# Patient Record
Sex: Female | Born: 1960 | Race: Black or African American | Hispanic: No | Marital: Married | Smoking: Former smoker
Health system: Southern US, Community
[De-identification: ages and names within clinical notes are randomized; demographics above are authoritative.]

## PROBLEM LIST (undated history)

## (undated) DIAGNOSIS — Z87898 Personal history of other specified conditions: Secondary | ICD-10-CM

## (undated) DIAGNOSIS — A64 Unspecified sexually transmitted disease: Secondary | ICD-10-CM

## (undated) DIAGNOSIS — Z8619 Personal history of other infectious and parasitic diseases: Secondary | ICD-10-CM

## (undated) DIAGNOSIS — I10 Essential (primary) hypertension: Secondary | ICD-10-CM

## (undated) DIAGNOSIS — Z8742 Personal history of other diseases of the female genital tract: Secondary | ICD-10-CM

## (undated) DIAGNOSIS — B009 Herpesviral infection, unspecified: Secondary | ICD-10-CM

## (undated) DIAGNOSIS — R7301 Impaired fasting glucose: Secondary | ICD-10-CM

## (undated) HISTORY — PX: CATARACT EXTRACTION: SUR2

## (undated) HISTORY — DX: Essential (primary) hypertension: I10

## (undated) HISTORY — DX: Impaired fasting glucose: R73.01

## (undated) HISTORY — DX: Personal history of other specified conditions: Z87.898

## (undated) HISTORY — PX: UPPER GI ENDOSCOPY: SHX6162

## (undated) HISTORY — PX: ABDOMINAL SURGERY: SHX537

## (undated) HISTORY — DX: Herpesviral infection, unspecified: B00.9

## (undated) HISTORY — DX: Personal history of other infectious and parasitic diseases: Z86.19

## (undated) HISTORY — DX: Unspecified sexually transmitted disease: A64

## (undated) HISTORY — DX: Personal history of other diseases of the female genital tract: Z87.42

---

## 1985-01-29 HISTORY — PX: ANKLE FRACTURE SURGERY: SHX122

## 1994-01-29 HISTORY — PX: TUBAL LIGATION: SHX77

## 1997-01-29 DIAGNOSIS — Z87898 Personal history of other specified conditions: Secondary | ICD-10-CM

## 1997-01-29 HISTORY — DX: Personal history of other specified conditions: Z87.898

## 1999-01-30 HISTORY — PX: CHOLECYSTECTOMY: SHX55

## 2004-01-30 DIAGNOSIS — Z8619 Personal history of other infectious and parasitic diseases: Secondary | ICD-10-CM

## 2004-01-30 HISTORY — DX: Personal history of other infectious and parasitic diseases: Z86.19

## 2006-08-26 ENCOUNTER — Other Ambulatory Visit: Admission: RE | Admit: 2006-08-26 | Discharge: 2006-08-26 | Payer: Self-pay | Admitting: Obstetrics and Gynecology

## 2007-08-27 ENCOUNTER — Other Ambulatory Visit: Admission: RE | Admit: 2007-08-27 | Discharge: 2007-08-27 | Payer: Self-pay | Admitting: Obstetrics & Gynecology

## 2007-09-24 ENCOUNTER — Other Ambulatory Visit: Admission: RE | Admit: 2007-09-24 | Discharge: 2007-09-24 | Payer: Self-pay | Admitting: Obstetrics and Gynecology

## 2010-04-20 ENCOUNTER — Other Ambulatory Visit (HOSPITAL_COMMUNITY): Payer: Self-pay | Admitting: Surgery

## 2010-04-27 ENCOUNTER — Encounter: Payer: BC Managed Care – PPO | Attending: Surgery | Admitting: *Deleted

## 2010-04-27 ENCOUNTER — Encounter: Payer: BC Managed Care – PPO | Admitting: *Deleted

## 2010-04-27 DIAGNOSIS — Z01818 Encounter for other preprocedural examination: Secondary | ICD-10-CM | POA: Insufficient documentation

## 2010-04-27 DIAGNOSIS — Z713 Dietary counseling and surveillance: Secondary | ICD-10-CM | POA: Insufficient documentation

## 2010-05-01 ENCOUNTER — Ambulatory Visit (HOSPITAL_COMMUNITY)
Admission: RE | Admit: 2010-05-01 | Discharge: 2010-05-01 | Disposition: A | Discharge status: Home / Self Care | Payer: BLUE CROSS/BLUE SHIELD | Source: Ambulatory Visit | Attending: Surgery | Admitting: Surgery

## 2010-05-01 DIAGNOSIS — Z01812 Encounter for preprocedural laboratory examination: Secondary | ICD-10-CM | POA: Insufficient documentation

## 2010-05-01 DIAGNOSIS — E785 Hyperlipidemia, unspecified: Secondary | ICD-10-CM | POA: Insufficient documentation

## 2010-05-01 DIAGNOSIS — K219 Gastro-esophageal reflux disease without esophagitis: Secondary | ICD-10-CM | POA: Insufficient documentation

## 2010-05-01 DIAGNOSIS — R609 Edema, unspecified: Secondary | ICD-10-CM | POA: Insufficient documentation

## 2010-05-01 DIAGNOSIS — Z6841 Body Mass Index (BMI) 40.0 and over, adult: Secondary | ICD-10-CM | POA: Insufficient documentation

## 2010-05-09 ENCOUNTER — Ambulatory Visit (HOSPITAL_COMMUNITY)
Admission: RE | Admit: 2010-05-09 | Discharge: 2010-05-09 | Disposition: A | Discharge status: Home / Self Care | Payer: BC Managed Care – PPO | Source: Ambulatory Visit | Attending: Surgery | Admitting: Surgery

## 2010-05-09 DIAGNOSIS — K219 Gastro-esophageal reflux disease without esophagitis: Secondary | ICD-10-CM | POA: Insufficient documentation

## 2010-05-09 DIAGNOSIS — Z6841 Body Mass Index (BMI) 40.0 and over, adult: Secondary | ICD-10-CM | POA: Insufficient documentation

## 2010-05-09 DIAGNOSIS — R609 Edema, unspecified: Secondary | ICD-10-CM | POA: Insufficient documentation

## 2010-05-09 DIAGNOSIS — Z13820 Encounter for screening for osteoporosis: Secondary | ICD-10-CM | POA: Insufficient documentation

## 2010-05-09 DIAGNOSIS — E785 Hyperlipidemia, unspecified: Secondary | ICD-10-CM | POA: Insufficient documentation

## 2010-05-18 ENCOUNTER — Ambulatory Visit (HOSPITAL_BASED_OUTPATIENT_CLINIC_OR_DEPARTMENT_OTHER): Payer: BC Managed Care – PPO | Attending: Surgery

## 2010-05-18 DIAGNOSIS — R0989 Other specified symptoms and signs involving the circulatory and respiratory systems: Secondary | ICD-10-CM | POA: Insufficient documentation

## 2010-05-18 DIAGNOSIS — G4733 Obstructive sleep apnea (adult) (pediatric): Secondary | ICD-10-CM | POA: Insufficient documentation

## 2010-05-18 DIAGNOSIS — R0609 Other forms of dyspnea: Secondary | ICD-10-CM | POA: Insufficient documentation

## 2010-05-27 DIAGNOSIS — G4730 Sleep apnea, unspecified: Secondary | ICD-10-CM

## 2010-05-27 DIAGNOSIS — R0609 Other forms of dyspnea: Secondary | ICD-10-CM

## 2010-05-27 DIAGNOSIS — G4710 Hypersomnia, unspecified: Secondary | ICD-10-CM

## 2010-05-27 DIAGNOSIS — R0989 Other specified symptoms and signs involving the circulatory and respiratory systems: Secondary | ICD-10-CM

## 2010-05-27 NOTE — Procedures (Signed)
NAMETERRY, Natalie Harding           ACCOUNT NO.:  [id number]  MEDICAL RECORD NO.:  [id number]          PATIENT TYPE:  OUT  LOCATION:  SLEEP CENTER                 FACILITY:  [facility]  PHYSICIAN:  Omolola Mittman D. Maple Hudson, MD, FCCP, FACPDATE OF BIRTH:  [birth year] 15, 1960  DATE OF STUDY:  05/18/2010                           NOCTURNAL POLYSOMNOGRAM  REFERRING PHYSICIAN:  MATTHEW B MARTIN  INDICATION FOR STUDY:  Hypersomnia with sleep apnea.  EPWORTH SLEEPINESS SCORE:  1/24, BMI 54.8.  Weight 319 pounds, height 64 inches.  Neck 14 inches.  MEDICATIONS:  Home medications are charted and reviewed.  SLEEP ARCHITECTURE:  Split-study protocol.  During the diagnostic phase, total sleep time 114 minutes with sleep efficiency 56.9%.  Stage I was 14.9%, stage II 70.2%, stage III absent, REM 14.9% of total sleep time. Sleep latency 19 minutes, REM latency 130 minutes, awake after sleep onset 51 minutes, arousal index 53.2.  BEDTIME MEDICATION:  None.  RESPIRATORY DATA:  Split-study protocol.  Apnea-hypopnea index (AHI) 16.8 per hour.  A total of 32 events was scored including 22 obstructive apneas, 3 central apneas, 7 hypopneas.  The events were seen in all sleep positions.  REM/AHI 77.6.  CPAP was then titrated to 14 CWP, AHI 0 per hour.  She wore a medium ResMed Mirage Quattro full-face mask with heated humidifier.  C-Flex of 3.  OXYGEN DATA:  Before CPAP snoring was mild to moderately loud with oxygen desaturation to a nadir of 80% on room air.  With CPAP titration, mean oxygen saturation held 95.7% on room air and snoring was prevented.  CARDIAC DATA:  Sinus rhythm with PVCs.  MOVEMENT-PARASOMNIA:  No significant movement disturbance.  No bathroom trips.  IMPRESSIONS-RECOMMENDATIONS: 1. Moderate obstructive sleep apnea/hypopnea syndrome, AHI 16.8 per     hour.  Non positional events with mild to moderately loud snoring     and oxygen desaturation to a nadir of 80% on room air. 2. Successful CPAP  titration to 14 CWP, AHI 0 per hour.  She wore a     medium ResMed Mirage Quattro full-face mask with heated humidifier     and C-Flex setting of 3.     Natalie Kentner D. Maple Hudson, MD, [facility], FACP Diplomate, Biomedical engineer of Sleep Medicine Electronically Signed    CDY/MEDQ  D:  05/27/2010 08:52:26  T:  05/27/2010 09:50:49  Job:  [id number]

## 2010-07-13 ENCOUNTER — Encounter: Payer: BC Managed Care – PPO | Attending: Surgery

## 2010-07-13 DIAGNOSIS — Z01818 Encounter for other preprocedural examination: Secondary | ICD-10-CM | POA: Insufficient documentation

## 2010-07-13 DIAGNOSIS — Z713 Dietary counseling and surveillance: Secondary | ICD-10-CM | POA: Insufficient documentation

## 2010-07-25 ENCOUNTER — Encounter (HOSPITAL_COMMUNITY): Payer: BC Managed Care – PPO

## 2010-07-25 ENCOUNTER — Other Ambulatory Visit (INDEPENDENT_AMBULATORY_CARE_PROVIDER_SITE_OTHER): Payer: Self-pay | Admitting: Surgery

## 2010-07-25 LAB — COMPREHENSIVE METABOLIC PANEL
Albumin: 3.6 g/dL (ref 3.5–5.2)
BUN: 12 mg/dL (ref 6–23)
Calcium: 9.8 mg/dL (ref 8.4–10.5)
Creatinine, Ser: 0.52 mg/dL (ref 0.50–1.10)
GFR calc Af Amer: >60 mL/min (ref >60)
Glucose, Bld: 91 mg/dL (ref 70–99)
Potassium: 4.2 mEq/L (ref 3.5–5.1)
Total Protein: 7.8 g/dL (ref 6.0–8.3)

## 2010-07-25 LAB — DIFFERENTIAL
Basophils Absolute: 0 10*3/uL (ref 0.0–0.1)
Basophils Relative: 0 % (ref 0–1)
Eosinophils Absolute: 0.1 10*3/uL (ref 0.0–0.7)
Eosinophils Relative: 2 % (ref 0–5)
Monocytes Absolute: 0.4 10*3/uL (ref 0.1–1.0)
Monocytes Relative: 8 % (ref 3–12)
Neutro Abs: 2.9 10*3/uL (ref 1.7–7.7)

## 2010-07-25 LAB — CBC
HCT: 40.4 % (ref 36.0–46.0)
MCH: 30.6 pg (ref 26.0–34.0)
MCHC: 32.9 g/dL (ref 30.0–36.0)
RDW: 13.5 % (ref 11.5–15.5)

## 2010-07-25 LAB — SURGICAL PCR SCREEN
MRSA, PCR: NEGATIVE
Staphylococcus aureus: NEGATIVE

## 2010-08-01 ENCOUNTER — Ambulatory Visit (HOSPITAL_COMMUNITY)
Admission: RE | Admit: 2010-08-01 | Discharge: 2010-08-02 | Disposition: A | Discharge status: Home / Self Care | Payer: BC Managed Care – PPO | Source: Ambulatory Visit | Attending: Surgery | Admitting: Surgery

## 2010-08-01 DIAGNOSIS — G4733 Obstructive sleep apnea (adult) (pediatric): Secondary | ICD-10-CM | POA: Insufficient documentation

## 2010-08-01 DIAGNOSIS — Z01812 Encounter for preprocedural laboratory examination: Secondary | ICD-10-CM | POA: Insufficient documentation

## 2010-08-01 DIAGNOSIS — Z6841 Body Mass Index (BMI) 40.0 and over, adult: Secondary | ICD-10-CM

## 2010-08-01 HISTORY — PX: LAPAROSCOPIC GASTRIC BANDING: SHX1100

## 2010-08-01 LAB — HEMOGLOBIN AND HEMATOCRIT, BLOOD: HCT: 39.7 % (ref 36.0–46.0)

## 2010-08-02 ENCOUNTER — Ambulatory Visit (HOSPITAL_COMMUNITY): Payer: BC Managed Care – PPO

## 2010-08-02 DIAGNOSIS — Z09 Encounter for follow-up examination after completed treatment for conditions other than malignant neoplasm: Secondary | ICD-10-CM

## 2010-08-02 LAB — DIFFERENTIAL
Basophils Absolute: 0 10*3/uL (ref 0.0–0.1)
Basophils Relative: 0 % (ref 0–1)
Eosinophils Absolute: 0.1 10*3/uL (ref 0.0–0.7)
Neutro Abs: 5.2 10*3/uL (ref 1.7–7.7)
Neutrophils Relative %: 59 % (ref 43–77)

## 2010-08-02 LAB — CBC
Hemoglobin: 12.8 g/dL (ref 12.0–15.0)
MCH: 30.3 pg (ref 26.0–34.0)
MCHC: 32.2 g/dL (ref 30.0–36.0)
Platelets: 206 10*3/uL (ref 150–400)
RBC: 4.23 MIL/uL (ref 3.87–5.11)

## 2010-08-04 NOTE — Op Note (Signed)
NAMEMARELY, APGAR           ACCOUNT NO.:  [id number]  MEDICAL RECORD NO.:  [id number]  LOCATION:  1522                         FACILITY:  [facility]  PHYSICIAN:  Thornton Park. Daphine Deutscher, MD  DATE OF BIRTH:  1960/03/09  DATE OF PROCEDURE: DATE OF DISCHARGE:                              OPERATIVE REPORT   PREOPERATIVE DIAGNOSES:  Morbid obesity, body mass index 50, with history of hepatitis C.  PROCEDURE:  Laparoscopic adjustable gastric banding (Allergan APL system).  SURGEON:  Thornton Park. Daphine Deutscher, MD  ASSISTANT:  Dr. Andrey Campanile  ANESTHESIA:  General endotracheal.  DESCRIPTION OF PROCEDURE:  This 50 year old African American lady was taken to room #1.  She had had history of occasional use of Tums and really did not GERD.  She is not taking any proton pump inhibitor or H2 blocker.  On a swallow, there was some thought that she might have reflux.  We, therefore, going to look at this at the time of surgery.  Abdomen was prepped with PCMX.  Time-out was performed.  The abdomen was entered through the left upper quadrant without difficulty and then the abdomen was insufflated.  I ran into a lot of adhesions in the midline from a previous laparoscopic cholecystectomy, I ended up taking these down.  I had to put a couple of clips on some of the blood vessels that were bleeding within the omentum.  We got this mobilized well, but it just took a little while.  Once I did that, I put in standard trocars including 15 in the right upper quadrant laterally and then 11 balloon to the right of the midline subsequently where the port would be placed. In the upper midline I placed a Nathanson retractor.  We then went to the foregut and began our survey and then dissection.  There was no obvious dimple.  I took down some adhesions and freed everything up and looked at this.  We passed the sizing balloon and with 10 cc we got it hang, although with more traction it eventually could slide above,  but initial impression was it hung pretty well.  I went posteriorly, but did not see much of a hiatal hernia.  I elected to go ahead and go with the anterior plication as a means of controlling this.  No significant hiatal hernia to close this found.  With band passer, we went along in a pars flaccida technique where the fat stripe crossed the right crus.  This came under and came out easily. An APL band was introduced, brought around, and buckled.  It was snapped over the sizing tubing.  It was then plicated with 3 sutures placed with tie knots and then a 4th anti-slip stitch was placed and tied in a free fashion.  The tubing was then brought to the lower pole on the right and connected to a port that had mesh on the back planted in the subcutaneous location.  Ports were injected with Exparel and closed with 4-0 Vicryl.  The patient had benzoin Steri-Strips applied and was taken to recovery room in satisfactory addition.     Thornton Park Daphine Deutscher, MD     MBM/MEDQ  D:  08/01/2010  T:  08/01/2010  Job:  [id number]  cc:   Doreen Salvage, PA [facility]  Electronically Signed by Luretha Murphy MD on 08/04/2010 07:48:48 AM

## 2010-08-15 ENCOUNTER — Encounter: Payer: BC Managed Care – PPO | Attending: Surgery

## 2010-08-15 DIAGNOSIS — Z713 Dietary counseling and surveillance: Secondary | ICD-10-CM | POA: Insufficient documentation

## 2010-08-15 DIAGNOSIS — Z9884 Bariatric surgery status: Secondary | ICD-10-CM

## 2010-08-15 DIAGNOSIS — Z01818 Encounter for other preprocedural examination: Secondary | ICD-10-CM | POA: Insufficient documentation

## 2010-08-15 NOTE — Progress Notes (Signed)
  2 Week Post-Operative Nutrition Class  Patient was seen on 08/15/10 for Post-Operative Nutrition education at [facility].   Surgery date: 08/01/10 Surgery type: LAGB  Weight today: 313.2lb Weight change: 16.8lbs since surgery Total weight lost: 16.8lbs BMI: 52.1%  The following the learning objective met the patient during this course:   Identifies Phase 3A (Soft, High Proteins) Dietary Goals and will begin from 2 weeks post-operatively to 2 months post-operatively   Identifies appropriate sources of fluids and proteins   States protein recommendations and appropriate sources post-operatively  Identifies the need for appropriate texture modifications, mastication, and bite sizes when consuming solids  Identifies appropriate multivitamin and calcium sources post-operatively  Describes the need for physical activity post-operatively and will follow MD recommendations  States when to call healthcare provider regarding medication questions or post-operative complications  Follow-Up Plan: Patient will follow-up at [facility] in 6 weeks for 2 months post-op nutrition visit for diet advancement per MD.

## 2010-08-15 NOTE — Patient Instructions (Signed)
Goals:  Follow Phase 3A: Soft High Protein Phase  Eat 3-6 small meals/snacks, every 3-5 hrs  Increase lean protein foods to meet 75g goal  Increase fluid intake to 64oz +  Avoid drinking 15 minutes before, during and 30 minutes after eating  Aim for >30 min of physical activity daily per MD

## 2010-08-17 ENCOUNTER — Ambulatory Visit (INDEPENDENT_AMBULATORY_CARE_PROVIDER_SITE_OTHER): Payer: BC Managed Care – PPO | Admitting: Surgery

## 2010-08-17 DIAGNOSIS — Z9884 Bariatric surgery status: Secondary | ICD-10-CM

## 2010-08-17 NOTE — Progress Notes (Signed)
The patient returns today 16 days post laparoscopic adjustable gastric band, APL system. She has lost 15.8 pounds today's BMI is 52.  Her incisions have healed nicely. Operative note was not available for me to review. We'll see her back around 17 August for her first band fill.

## 2010-09-07 ENCOUNTER — Ambulatory Visit (INDEPENDENT_AMBULATORY_CARE_PROVIDER_SITE_OTHER): Payer: BC Managed Care – PPO | Admitting: Surgery

## 2010-09-07 DIAGNOSIS — Z9884 Bariatric surgery status: Secondary | ICD-10-CM

## 2010-09-07 NOTE — Progress Notes (Signed)
Doing well.  I added 2.5 cc to her APL band.  She drank a glass of water .  Return 6 weeks

## 2010-09-26 ENCOUNTER — Encounter: Payer: BC Managed Care – PPO | Attending: Surgery | Admitting: *Deleted

## 2010-09-26 ENCOUNTER — Encounter: Payer: Self-pay | Admitting: *Deleted

## 2010-09-26 DIAGNOSIS — Z01818 Encounter for other preprocedural examination: Secondary | ICD-10-CM | POA: Insufficient documentation

## 2010-09-26 DIAGNOSIS — Z9884 Bariatric surgery status: Secondary | ICD-10-CM

## 2010-09-26 DIAGNOSIS — Z713 Dietary counseling and surveillance: Secondary | ICD-10-CM | POA: Insufficient documentation

## 2010-09-26 NOTE — Progress Notes (Signed)
  Follow-up visit: 8 Weeks Post-Operative LAGB Surgery  Medical Nutrition Therapy:  Appt start time: 1000 end time:  1030.  Assessment:  Primary concerns today: post-operative bariatric surgery nutrition management. Pt reports no problems with LAGB or eating. She feels like she is able to eat more than she would like to. She feels its time for a 2nd band fill.   Weight today: 308.2 lbs Weight change: 4 lbs Total weight lost: 21.8 lbs BMI: 51.2 Weight goal: 150 lbs  24-hr recall:  B (7-9 AM): 1/2 Atkin's shake  L (2 PM): 2 boiled eggs w/ 2 pc thin bacon Snk (4-5 PM): Pretzels (thin) or  Bag of cheetos (snack sized) D (7 PM): Grilled chicken salad (Zaxby's) OR Spaghetti w/ meat sauce OR Chicken wings Snk (9-10 PM): popcorn OR pc fruit  Fluid intake: vitamin water zero, crystal light, propel zero, protein shake = 40 oz Estimated total protein intake: 30-45g  Medications: No changes at this point Supplementation: Taking supplements 2-3 times/week  Using straws: No Drinking while eating: No Hair loss: No Carbonated beverages: Yes, pt has had one with reported side effect of bloating N/V/D/C: None reproted Last Lap-Band fill: Pt has only had one fill per Dr. Daphine Deutscher  Recent physical activity:  No structured exercise at this point  Progress Towards Goal(s):  In progress.  Handouts given during visit include:  Phase 3B: High Protein w/ NS Vegetables   Nutritional Diagnosis:  [place]-3.3 Overweight/obesity As related to recent LAGB.  As evidenced by pt following LAGB dietary guidelines for continued weight loss.    Intervention:    Follow Phase 3B: High Protein + Non-Starchy Vegetables  Eat 3-6 small meals/snacks, every 3-5 hrs  Increase lean protein foods to meet 85g goal  Increase fluid intake to 64oz +  Avoid drinking 15 minutes before, during and 30 minutes after eating  Aim for >30 min of physical activity daily  D/C high carbohydrate snacks   Monitoring/Evaluation:   Dietary intake, exercise, lap band fills, and body weight. Follow up in 6 weeks  for 3 month post-op visit.

## 2010-09-26 NOTE — Patient Instructions (Addendum)
Goals:  Follow Phase 3B: High Protein + Non-Starchy Vegetables  Eat 3-6 small meals/snacks, every 3-5 hrs  Increase lean protein foods to meet 85g goal  Increase fluid intake to 64oz +  Avoid drinking 15 minutes before, during and 30 minutes after eating  Aim for >30 min of physical activity daily  D/C high carbohydrate snacks

## 2010-10-19 ENCOUNTER — Ambulatory Visit (INDEPENDENT_AMBULATORY_CARE_PROVIDER_SITE_OTHER): Payer: BC Managed Care – PPO | Admitting: Surgery

## 2010-10-19 DIAGNOSIS — Z9884 Bariatric surgery status: Secondary | ICD-10-CM

## 2010-10-19 NOTE — Progress Notes (Signed)
The patient is 2.6 months postop and has lost 19.2 pounds. Today's weight is 311.6. I added 1.5 cc to her band. It gives her a total of about 3 cc to her APL band. I'll see her back in for 5 months and followup.

## 2010-11-07 ENCOUNTER — Encounter: Payer: Self-pay | Admitting: *Deleted

## 2010-11-07 ENCOUNTER — Encounter: Payer: BC Managed Care – PPO | Admitting: *Deleted

## 2010-11-07 ENCOUNTER — Encounter: Payer: BC Managed Care – PPO | Attending: Surgery | Admitting: *Deleted

## 2010-11-07 VITALS — Ht 65.0 in | Wt 306.8 lb

## 2010-11-07 DIAGNOSIS — Z713 Dietary counseling and surveillance: Secondary | ICD-10-CM | POA: Insufficient documentation

## 2010-11-07 DIAGNOSIS — Z01818 Encounter for other preprocedural examination: Secondary | ICD-10-CM | POA: Insufficient documentation

## 2010-11-07 DIAGNOSIS — Z9884 Bariatric surgery status: Secondary | ICD-10-CM

## 2010-11-07 NOTE — Patient Instructions (Signed)
Goals:  Follow Phase 3B: High Protein + Non-Starchy Vegetables  Eat 3-6 small meals/snacks, every 3-5 hrs  Avoid meal skipping  Increase fluid intake to 64oz +  Add 15 grams of carbohydrate (fruit, whole grain, starchy vegetable) with meals  Aim for >30 min of physical activity daily  D/C Concentrated Sweets (ice cream cookies, icee's)

## 2010-11-07 NOTE — Progress Notes (Signed)
  Follow-up visit: 14 Weeks Post-Operative LAGB Surgery  Medical Nutrition Therapy:  Appt start time: 1530 end time:  1600.  Assessment:  Primary concerns today: post-operative bariatric surgery nutrition management. Natalie Harding reports that she is able to physically eat more than she should. She notes that she stays hungry all the time. She reports that she frequently skips meals due to her schedule and them "over eats" in the evening hours. Natalie Harding feels that she needs more restriction in her band.  Weight today: 306.8 lb Weight change: 1.4 lb since last visit Total weight lost: 23.2 lbs BMI: 51.2% Weight goal: 150 lbs  24-hr recall:  B (7-10 AM): Protein shake OR boiled egg, 3 pieces of bacon Snk (AM): SKIPS   L (PM): N/A Snk (PM): Icee (32oz), Cookies, OR Ice creams   D (6-8 PM): Baked chicken w/ steamed broccoli OR Zaxby's wings (5) and 1/2 side salad Snk (9-10 PM): Protein bar (10g)  Fluid intake: 32-45 oz Estimated total protein intake:~ 60g  Medications: No changes Supplementation: Taking supplements regularly  Using straws: No Drinking while eating:No Hair loss: No Carbonated beverages: No N/V/D/C: None reported Last Lap-Band fill: Pt has had 2 fills at this point  Recent physical activity:  Very limited. Pt reports no structured exercise  Progress Towards Goal(s):  Some progress.  Handouts given during visit include:  Carbohydrate counting "Yellow Card"   Nutritional Diagnosis:  [place]-3.3 Overweight/obesity As related to recent LAGB surgery.  As evidenced by pt attempting to follow LAGB guidelines for continued weight loss.    Intervention:  Nutrition education.  Monitoring/Evaluation:  Dietary intake, exercise, lap band fills, and body weight. Follow up in 3 months for 6 month post-op visit.

## 2010-12-07 ENCOUNTER — Encounter (INDEPENDENT_AMBULATORY_CARE_PROVIDER_SITE_OTHER): Payer: Self-pay | Admitting: Surgery

## 2010-12-07 ENCOUNTER — Ambulatory Visit (INDEPENDENT_AMBULATORY_CARE_PROVIDER_SITE_OTHER): Payer: BC Managed Care – PPO | Admitting: Surgery

## 2010-12-07 VITALS — BP 128/90 | HR 68 | Temp 96.7°F | Resp 16 | Ht 65.0 in | Wt 308.0 lb

## 2010-12-07 DIAGNOSIS — Z9884 Bariatric surgery status: Secondary | ICD-10-CM

## 2010-12-07 NOTE — Progress Notes (Signed)
[name] 50 y.o.  Body mass index is 51.25 kg/(m^2).  Patient Active Problem List  Diagnoses  . Hx of laparoscopic gastric banding    No Known Allergies  Past Surgical History  Procedure Date  . Laparoscopic gastric banding 08/01/10   BULLA,DONALD, MD No diagnosis found.  Natalie Harding comes in today and she is 4.2 months postop. Thus far she has lost 22.8 pounds and today's BMI is 51.2. Today's weight is 308. She reports that she is not feeling the restriction. I added another 1.5 cc to remain bring her to a total of 4.5 cc of been added since her surgery. I'll see her back in for 5 weeksM. Matt B. Daphine Deutscher, MD, [facility] [phone] beeper [phone]  12/07/2010 11:22 AM

## 2010-12-07 NOTE — Patient Instructions (Addendum)

## 2011-01-01 ENCOUNTER — Encounter: Payer: BC Managed Care – PPO | Attending: Surgery | Admitting: *Deleted

## 2011-01-01 DIAGNOSIS — Z01818 Encounter for other preprocedural examination: Secondary | ICD-10-CM | POA: Insufficient documentation

## 2011-01-01 DIAGNOSIS — Z9884 Bariatric surgery status: Secondary | ICD-10-CM

## 2011-01-01 DIAGNOSIS — Z713 Dietary counseling and surveillance: Secondary | ICD-10-CM | POA: Insufficient documentation

## 2011-01-01 NOTE — Progress Notes (Signed)
  Follow-up visit: 5.5 Months Post-Operative LAGB Surgery  Medical Nutrition Therapy:  Appt start time: 1100 end time:  1030.  Assessment:  Primary concerns today: post-operative bariatric surgery nutrition management.  Weight today: 305.3 lbs Weight change: 1.5 lbs Total weight lost: 24.7 lbs total BMI: 50.9% Pt's Set Weight Goal: 150 lbs  Surgery date: 08/01/10 [facility] weight at [facility]: 330 lbs  24-hr recall:  B (9-10 AM): Protein Shake OR Bacon, 2 egg, 1/2 cup grits (on weekends) Snk (AM): N/A   L (2 PM): Grilled chicken breast, salad Snk (PM): n/a  D (PM): SKIPS Snk (6-8 PM): peanuts OR popcorn OR Protein bar OR SF jello  Fluid intake: unsweetened tea, water, coffee = 30-40 oz Estimated total protein intake: 60-65 g/day  Medications: No medication changes Supplementation: Taking supplements "sportatically" (2 times/week)  Using straws: NO Drinking while eating: No Hair loss: No Carbonated beverages: No N/V/D/C: No Last Lap-Band fill: Pt has had one additional fill since her last visit; She notes that she is able to eat  Recent physical activity:  Limited; No structured activity reported  Progress Towards Goal(s):  No progress.  Nutritional Diagnosis:  [place]-5.7.1 Inadequate protein intake As related to sportic meal times and limited protein intake.  As evidenced by pt meeting <70% of estimated protein recommendations.    Intervention:  Nutrition education.  Monitoring/Evaluation:  Dietary intake, exercise, lap band fills, and body weight. Follow up in 3 months for 9 month post-op visit.

## 2011-01-01 NOTE — Patient Instructions (Signed)
Goals:  Follow Phase 3B: High Protein + Non-Starchy Vegetables  Eat 3-6 small meals/snacks, every 3-5 hrs  Increase lean protein foods to meet 80-100g goal  Increase fluid intake to 64oz +  Add 15 grams of carbohydrate (fruit, whole grain, starchy vegetable) with meals  Avoid drinking 15 minutes before, during and 30 minutes after eating  Aim for >30 min of physical activity daily 

## 2011-01-17 ENCOUNTER — Ambulatory Visit (INDEPENDENT_AMBULATORY_CARE_PROVIDER_SITE_OTHER): Payer: BC Managed Care – PPO | Admitting: Surgery

## 2011-01-17 ENCOUNTER — Encounter (INDEPENDENT_AMBULATORY_CARE_PROVIDER_SITE_OTHER): Payer: BC Managed Care – PPO | Admitting: Surgery

## 2011-01-17 ENCOUNTER — Encounter (INDEPENDENT_AMBULATORY_CARE_PROVIDER_SITE_OTHER): Payer: Self-pay | Admitting: Surgery

## 2011-01-17 VITALS — BP 126/82 | HR 68 | Temp 97.8°F | Resp 16 | Ht 65.0 in | Wt 303.4 lb

## 2011-01-17 DIAGNOSIS — Z9884 Bariatric surgery status: Secondary | ICD-10-CM

## 2011-01-17 DIAGNOSIS — Z4651 Encounter for fitting and adjustment of gastric lap band: Secondary | ICD-10-CM

## 2011-01-17 NOTE — Progress Notes (Signed)
Natalie Harding Body mass index is 50.49 kg/(m^2).  Having regurgitation:  no  Nocturnal reflux?  no  Amount of fill  1 cc  Return 6 weeks

## 2011-01-17 NOTE — Patient Instructions (Signed)

## 2011-02-16 ENCOUNTER — Encounter (INDEPENDENT_AMBULATORY_CARE_PROVIDER_SITE_OTHER): Payer: Self-pay

## 2011-02-16 ENCOUNTER — Ambulatory Visit (INDEPENDENT_AMBULATORY_CARE_PROVIDER_SITE_OTHER): Payer: BC Managed Care – PPO | Admitting: Physician Assistant

## 2011-02-16 VITALS — BP 138/86 | Ht 65.0 in | Wt 307.4 lb

## 2011-02-16 DIAGNOSIS — Z4651 Encounter for fitting and adjustment of gastric lap band: Secondary | ICD-10-CM

## 2011-02-16 NOTE — Progress Notes (Signed)
  HISTORY: Natalie Harding is a 51 y.o.female who received an AP-Large lap-band in July 2012 by Dr. Daphine Deutscher. She has had 4 lbs of weight gain since her last appointment. She says she still gets hungry fairly soon after eating but her portion sizes havent increased terribly. She tends to return to food that she hasn't eaten at a meal. No persistent reflux or regurgitation.Marland Kitchen  VITAL SIGNS: Filed Vitals:   02/16/11 1047  BP: 138/86    PHYSICAL EXAM: Physical exam reveals a very well-appearing 51 y.o.female in no apparent distress Neurologic: Awake, alert, oriented Psych: Bright affect, conversant Respiratory: Breathing even and unlabored. No stridor or wheezing Abdomen: Soft, nontender, nondistended to palpation. Incisions well-healed. No incisional hernias. Port easily palpated. Extremities: Atraumatic, good range of motion.  ASSESMENT: 51 y.o.  female  s/p AP-Large lap-band.   PLAN: The patient's port was accessed with a 20G Huber needle without difficulty. Clear fluid was aspirated and 0.5 mL saline was added to the port. The patient was able to swallow water without difficulty following the procedure and was instructed to take clear liquids for the next 24-48 hours and advance slowly as tolerated.

## 2011-02-16 NOTE — Patient Instructions (Signed)
Take clear liquids for the next 48 hours. Thin protein shakes are ok to start on Saturday evening. You may begin foods with the consistency of yogurt, cottage cheese, cream soups, etc. on Sunday. Call us if you have persistent vomiting or regurgitation, night cough or reflux symptoms. Return as scheduled or sooner if you notice no changes in hunger/portion sizes.   

## 2011-03-23 ENCOUNTER — Ambulatory Visit (INDEPENDENT_AMBULATORY_CARE_PROVIDER_SITE_OTHER): Payer: BC Managed Care – PPO | Admitting: Physician Assistant

## 2011-03-23 ENCOUNTER — Encounter (INDEPENDENT_AMBULATORY_CARE_PROVIDER_SITE_OTHER): Payer: Self-pay

## 2011-03-23 VITALS — BP 136/80 | HR 70 | Temp 97.8°F | Resp 16 | Ht 65.0 in | Wt 300.4 lb

## 2011-03-23 DIAGNOSIS — Z4651 Encounter for fitting and adjustment of gastric lap band: Secondary | ICD-10-CM

## 2011-03-23 NOTE — Patient Instructions (Signed)
Take clear liquids for the next 48 hours. Thin protein shakes are ok to start on Saturday evening. You may begin foods with the consistency of yogurt, cottage cheese, cream soups, etc. on Sunday. Call us if you have persistent vomiting or regurgitation, night cough or reflux symptoms. Return as scheduled or sooner if you notice no changes in hunger/portion sizes.   

## 2011-03-23 NOTE — Progress Notes (Signed)
  HISTORY: Natalie Harding is a 51 y.o.female who received an AP-Large lap-band in July 2012 by Dr. Daphine Deutscher. She has no new complaints. She denies persistent vomiting or regurgitation. She says she has some increasing hunger and portion sizes.  VITAL SIGNS: Filed Vitals:   03/23/11 0955  BP: 136/80  Pulse: 70  Temp: 97.8 F (36.6 C)  Resp: 16    PHYSICAL EXAM: Physical exam reveals a very well-appearing 51 y.o.female in no apparent distress Neurologic: Awake, alert, oriented Psych: Bright affect, conversant Respiratory: Breathing even and unlabored. No stridor or wheezing Abdomen: Soft, nontender, nondistended to palpation. Incisions well-healed. No incisional hernias. Port easily palpated. Extremities: Atraumatic, good range of motion.  ASSESMENT: 50 y.o.  female  s/p AP-Large lap-band.   PLAN: The patient's port was accessed with a 20G Huber needle without difficulty. Clear fluid was aspirated and 0.5 mL saline was added to the port. The patient was able to swallow water without difficulty following the procedure and was instructed to take clear liquids for the next 24-48 hours and advance slowly as tolerated.

## 2011-04-02 ENCOUNTER — Ambulatory Visit: Payer: BC Managed Care – PPO | Admitting: *Deleted

## 2011-04-05 ENCOUNTER — Encounter (INDEPENDENT_AMBULATORY_CARE_PROVIDER_SITE_OTHER): Payer: Self-pay

## 2011-04-05 ENCOUNTER — Ambulatory Visit (INDEPENDENT_AMBULATORY_CARE_PROVIDER_SITE_OTHER): Payer: BC Managed Care – PPO | Admitting: Physician Assistant

## 2011-04-05 VITALS — BP 126/72 | HR 68 | Temp 97.9°F | Resp 18 | Ht 65.0 in | Wt 300.0 lb

## 2011-04-05 DIAGNOSIS — Z4651 Encounter for fitting and adjustment of gastric lap band: Secondary | ICD-10-CM

## 2011-04-05 NOTE — Patient Instructions (Signed)
Return in April as scheduled. Return sooner if your trouble with regurgitation returns.

## 2011-04-05 NOTE — Progress Notes (Signed)
  HISTORY: Natalie Harding is a 51 y.o.female who received an AP-Large lap-band in July 2012 by Dr. Daphine Deutscher. She was seen two weeks ago for an adjustment but unfortunately she's had persistent regurgitation and solid food dysphagia since then, with "more bad days than good." Liquids go down okay for the most part but in the mornings even this becomes a challenge.  VITAL SIGNS: Filed Vitals:   04/05/11 0856  BP: 126/72  Pulse: 68  Temp: 97.9 F (36.6 C)  Resp: 18    PHYSICAL EXAM: Physical exam reveals a very well-appearing 51 y.o.female in no apparent distress Neurologic: Awake, alert, oriented Psych: Bright affect, conversant Respiratory: Breathing even and unlabored. No stridor or wheezing Abdomen: Soft, nontender, nondistended to palpation. Incisions well-healed. No incisional hernias. Port easily palpated. Extremities: Atraumatic, good range of motion.  ASSESMENT: 51 y.o.  female  s/p AP-Large lap-band.   PLAN: The patient's port was accessed with a 20G Huber needle without difficulty. Clear fluid was aspirated and 0.25 mL saline was removed, which was half of the fill done two weeks ago. The patient was able to swallow water without difficulty following the procedure. She'll return as appointed in April or sooner if needed.

## 2011-05-03 ENCOUNTER — Ambulatory Visit: Payer: BC Managed Care – PPO | Admitting: *Deleted

## 2011-05-03 ENCOUNTER — Encounter (INDEPENDENT_AMBULATORY_CARE_PROVIDER_SITE_OTHER): Payer: BC Managed Care – PPO

## 2011-07-30 IMAGING — RF DG UGI W/ KUB
15 of 18 series · 19 of 24 positions shown · non-contrast
Comparison: None.

CLINICAL DATA: Preoperative screening for bariatric procedure

UPPER GI SERIES WITH KUB
TECHNIQUE: Routine upper GI series was performed with thin and
high density barium.

[Series 1: run · 2 of 26 slices shown (1 of 15)]
[im 1/26]
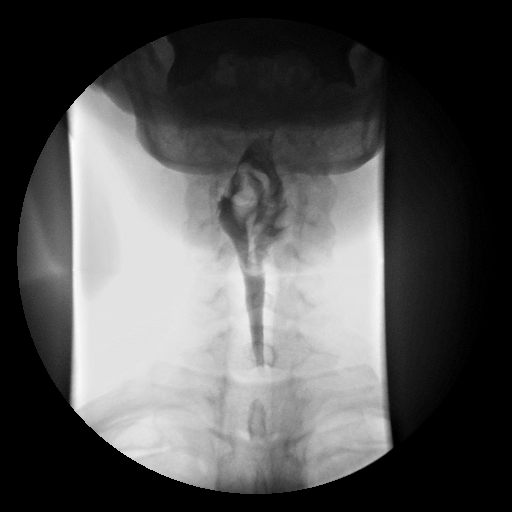
[im 26/26]
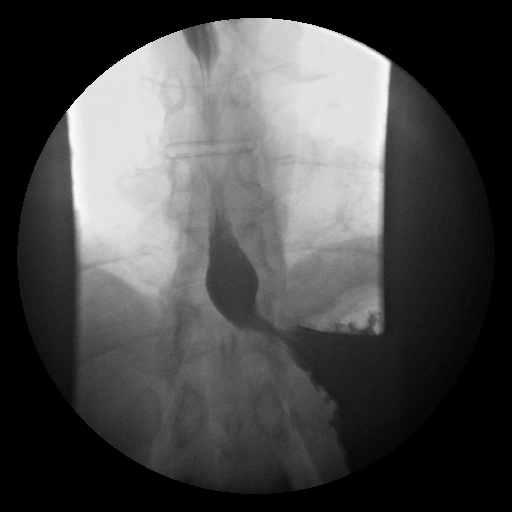

[Series 2: run · 3 of 32 slices shown (2 of 15)]
[im 11/32]
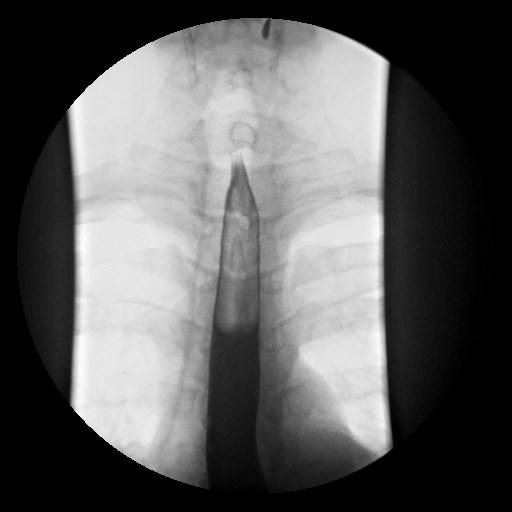
[im 21/32]
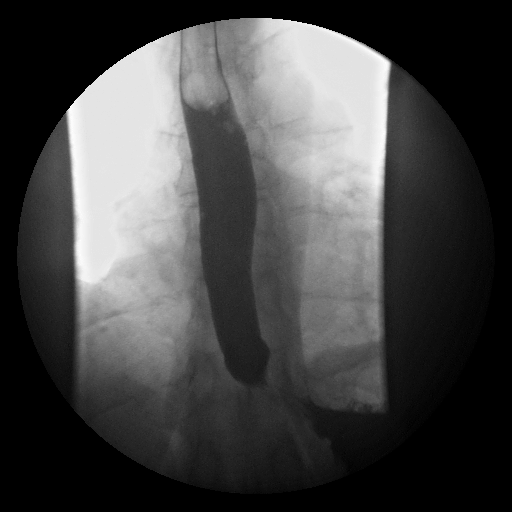
[im 32/32]
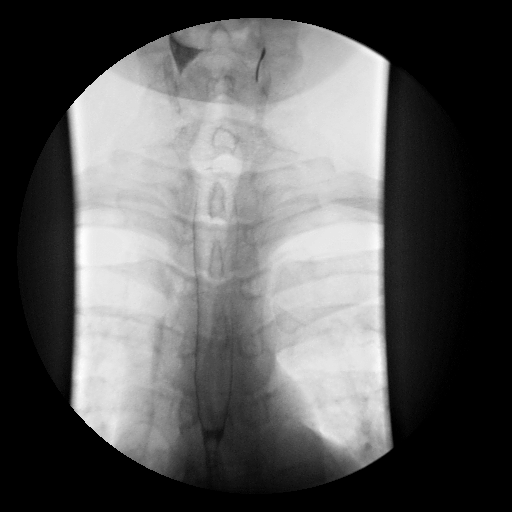

[Series 3: run · 2 of 23 slices shown (3 of 15)]
[im 1/23]
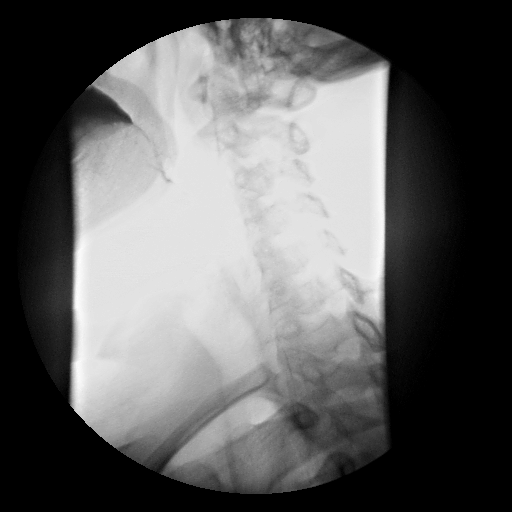
[im 23/23]
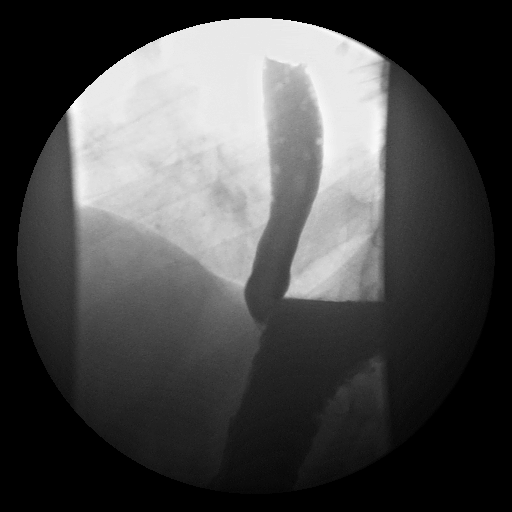

[Series 4: run · 1 of 1 slices shown (4 of 15)]
[im 1/1]
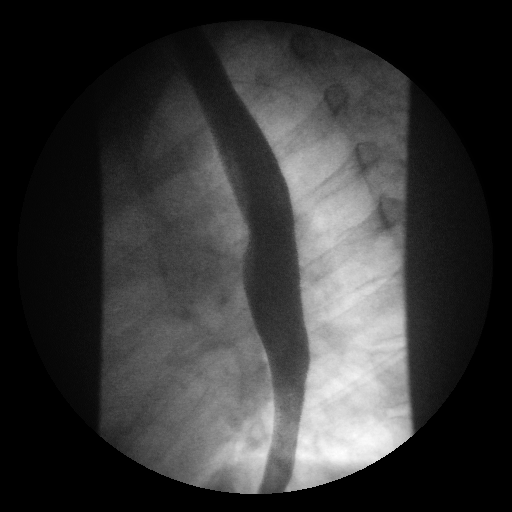

[Series 5: run · 1 of 1 slices shown (5 of 15)]
[im 1/1]
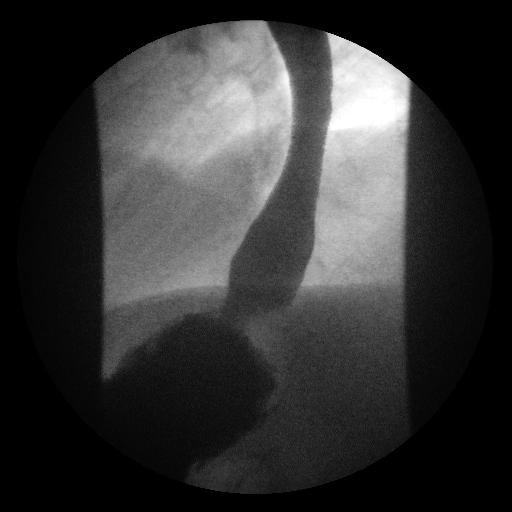

[Series 7: run · 1 of 1 slices shown (6 of 15)]
[im 1/1]
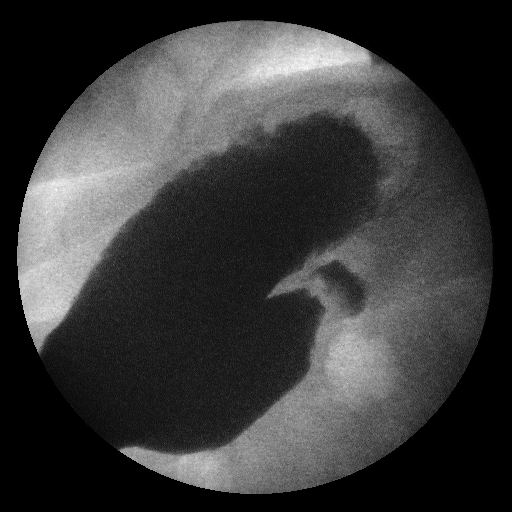

[Series 8: run · 1 of 1 slices shown (7 of 15)]
[im 1/1]
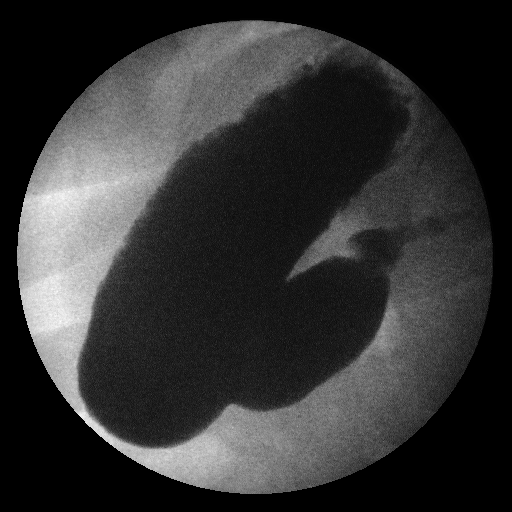

[Series 9: run · 1 of 1 slices shown (8 of 15)]
[im 1/1]
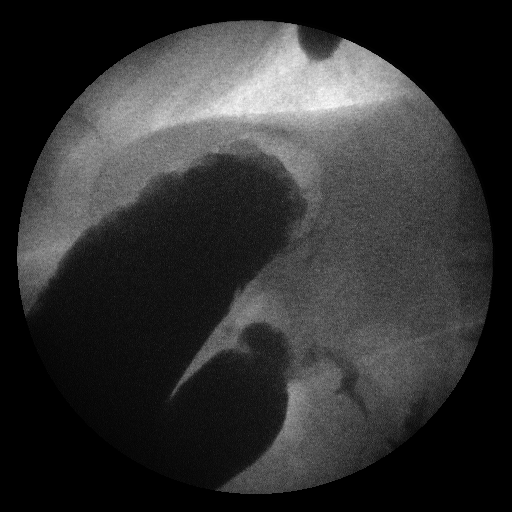

[Series 10: run · 1 of 1 slices shown (9 of 15)]
[im 1/1]
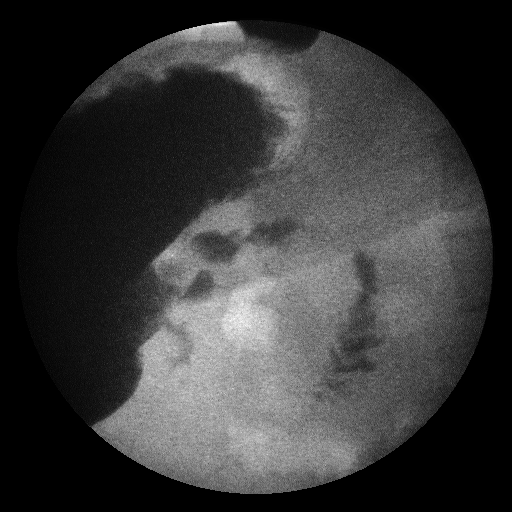

[Series 12: run · 1 of 1 slices shown (10 of 15)]
[im 1/1]
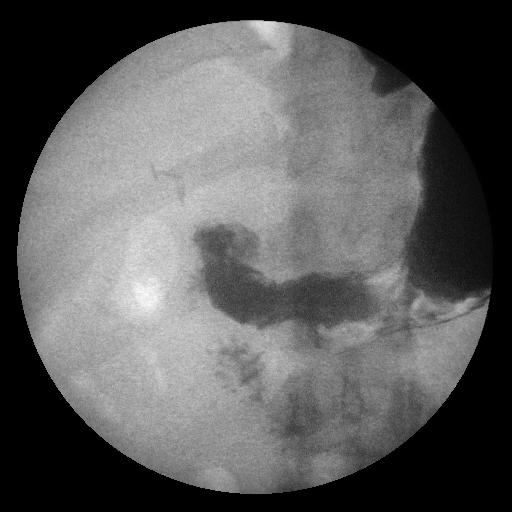

[Series 13: run · 1 of 1 slices shown (11 of 15)]
[im 1/1]
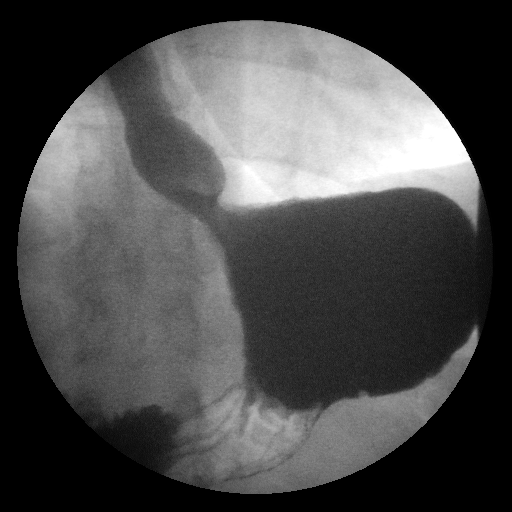

[Series 14: run · 1 of 1 slices shown (12 of 15)]
[im 1/1]
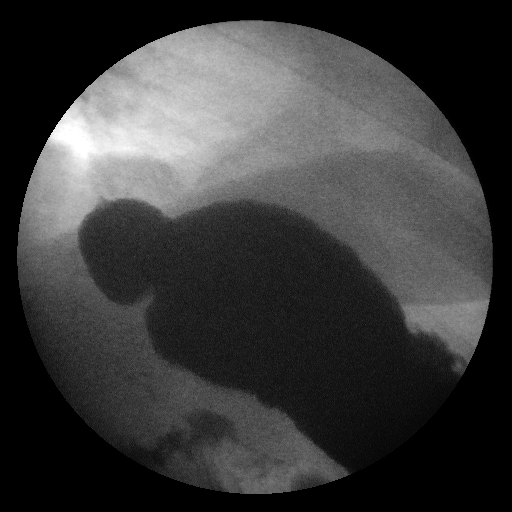

[Series 15: run · 1 of 1 slices shown (13 of 15)]
[im 1/1]
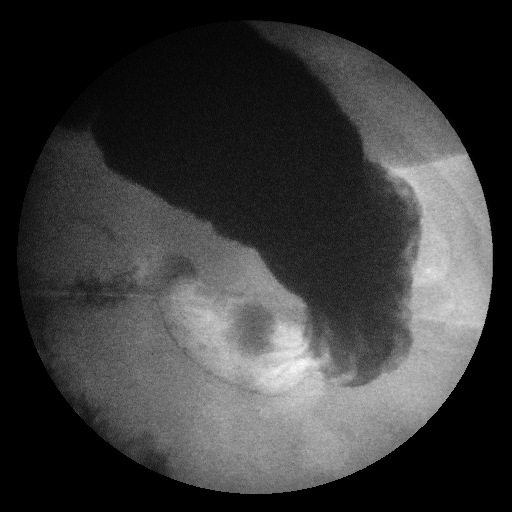

[Series 17: run · 1 of 1 slices shown (14 of 15)]
[im 1/1]
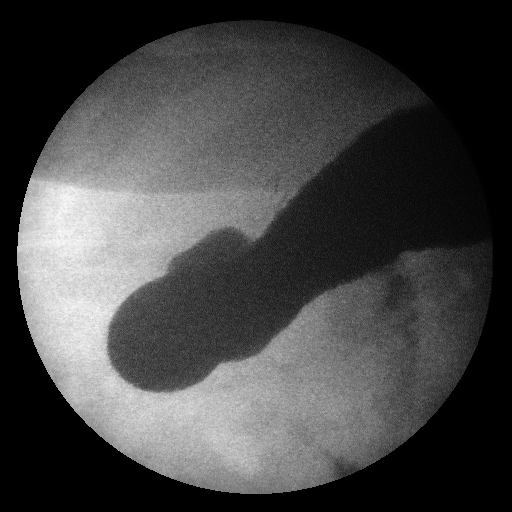

[Series 18: run · 1 of 1 slices shown (15 of 15)]
[im 1/1]
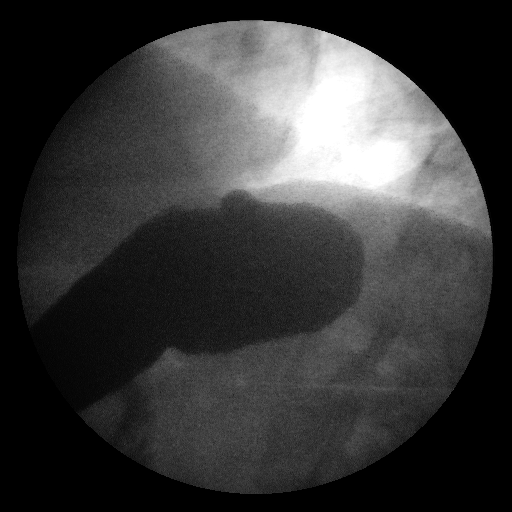

[19 of 24 positions shown; findings below may reference images not displayed]

FINDINGS: On the KUB, the stool and bowel gas pattern is within
normal limits.  Surgical clips overlie the region of the
gallbladder fossa.  The lung bases are clear.  The osseous
structures are unremarkable.

There is a moderate size sliding type hiatal hernia and reflux at
the EG junction.  Otherwise, the esophagus, stomach, duodenal bulb,
and remainder of the C-loop have a normal appearance with no
evidence of stricture, holdup, fixed filling defect, or ulceration.
IMPRESSION: Moderate size sliding type hiatal hernia and reflux at the EG
junction.

## 2011-09-10 LAB — HM PAP SMEAR: HM Pap smear: NEGATIVE

## 2011-12-18 LAB — HM MAMMOGRAPHY: HM Mammogram: NORMAL

## 2012-02-14 ENCOUNTER — Telehealth (INDEPENDENT_AMBULATORY_CARE_PROVIDER_SITE_OTHER): Payer: Self-pay | Admitting: Surgery

## 2012-02-14 NOTE — Telephone Encounter (Signed)
02/14/12 I spk to pt re: making a bariatric follow-up appt with Dr. Daphine Deutscher.  Pt has a lap band - her last fill adjustment was 04/05/11.  Pt stated "she owes [place] money (900.00+) and was previously told she would need to pay this balance before being seen." Pt requested to speak to a nurse regarding some issues she was having..I transferred the call to triage. (lss)

## 2012-02-14 NOTE — Telephone Encounter (Signed)
Pt called to report reflux.  She is lap-band pt; states she is behind with payments, so hasn't been in quite a long time for fill.  She is experiencing reflux nightly.  Recommended elevating her bed onto blocks.  OK to try OTC Prilosec.  Use BID x 2 weeks, then drop to QD. She will call back prn.

## 2012-06-04 ENCOUNTER — Telehealth: Payer: Self-pay | Admitting: Certified Nurse Midwife

## 2012-06-04 NOTE — Telephone Encounter (Signed)
Left message on CB# of need to return call concerning appt. Monday with Dr. Farrel Gobble for breast pain and info on mammogram could be done @ 5/30pm . sue

## 2012-06-04 NOTE — Telephone Encounter (Signed)
Patient calling re: left breast pain for last two days. Patient can only come after 5:30 to see [place]. She is fine to see Dr. Farrel Gobble on a Monday but wants to speak with nurse re: finding a facility to do a diagnostic MMG after 5:30 as well and also double check it is okay to wait until next Monday to see Dr. Farrel Gobble.  Patient is training for a new job and that is why she needs to be seen after 5:30.

## 2012-06-04 NOTE — Telephone Encounter (Signed)
Patient states is having left breast pain. Has done breast exam does not feel and lumps. Patient scheduled for Monday  5/30pm with Dr. Farrel Gobble. sue

## 2012-06-06 ENCOUNTER — Encounter: Payer: Self-pay | Admitting: Gynecology

## 2012-06-09 ENCOUNTER — Encounter: Payer: Self-pay | Admitting: Gynecology

## 2012-06-09 ENCOUNTER — Ambulatory Visit (INDEPENDENT_AMBULATORY_CARE_PROVIDER_SITE_OTHER): Payer: BC Managed Care – PPO | Admitting: Gynecology

## 2012-06-09 VITALS — BP 130/80 | HR 90 | Resp 18 | Ht 64.0 in | Wt 306.0 lb

## 2012-06-09 DIAGNOSIS — I10 Essential (primary) hypertension: Secondary | ICD-10-CM

## 2012-06-09 DIAGNOSIS — Z9884 Bariatric surgery status: Secondary | ICD-10-CM

## 2012-06-09 DIAGNOSIS — R079 Chest pain, unspecified: Secondary | ICD-10-CM

## 2012-06-09 NOTE — Progress Notes (Deleted)
Patient ID: Natalie Harding, female   DOB: Jan 12, 1961, 52 y.o.   MRN: [id number] Pt presents today for pain in her left breast that started one week ago.  Last MMG was 12/18/11 at [facility] and was normal.

## 2012-06-09 NOTE — Progress Notes (Signed)
Subjective:     Patient ID: Natalie Harding, female   DOB: [birth year], 52 y.o.   MRN: [id number]  HPI Comments: Pt presents today for pain in her left breast that occured one week ago.  Last MMG was 12/18/11 at [facility] and was normal.  Pt did not feel any mass, pt descibes pain as starting in midline and radiating under breast to back.  Dull in nature, Pt denies precipitating factors, or anything that relieved the pain, no association with food, no nausea.  Pt has since resolved.  Woken up from sleep but none since.  Pt does not due regular breast exam-too lumpy.  No skin changes.  No dietary changes or bowel changes. Pt reports went to bed about 2h after eating, she reports some GERD after lapband.  Doesn't recall what she ate before bed that night. Hasn't seen GI in over 39m due to insurance changes    Review of Systems Per HPI    Objective:   Physical Exam  Constitutional: She appears well-developed and well-nourished.  Cardiovascular: Normal rate, regular rhythm and normal heart sounds.  Exam reveals no friction rub.   No murmur heard. Pulmonary/Chest: Effort normal and breath sounds normal. No respiratory distress. She has no wheezes.  Abdominal: Soft.  Genitourinary: No breast swelling, tenderness or discharge. Pelvic exam was performed with patient supine.       Assessment:     Nonspecific chest pain-resolved     Plan:     No evidence of breast involvement.  Pt at increased risk for both cardiac and GI/GERD issues based on weight and lapband.  Recommend pt make an appt with GI as she now has new insurance, stressed that pt see PCP regarding her other health issues. Encourage pt to start regular breast exams

## 2012-07-10 ENCOUNTER — Other Ambulatory Visit: Payer: Self-pay

## 2012-07-10 DIAGNOSIS — Z1231 Encounter for screening mammogram for malignant neoplasm of breast: Secondary | ICD-10-CM

## 2012-08-13 ENCOUNTER — Ambulatory Visit: Payer: BC Managed Care – PPO

## 2012-09-10 ENCOUNTER — Ambulatory Visit: Payer: Self-pay | Admitting: Certified Nurse Midwife

## 2012-10-15 ENCOUNTER — Encounter: Payer: Self-pay | Admitting: Certified Nurse Midwife

## 2012-10-15 ENCOUNTER — Ambulatory Visit (INDEPENDENT_AMBULATORY_CARE_PROVIDER_SITE_OTHER): Payer: BC Managed Care – PPO | Admitting: Certified Nurse Midwife

## 2012-10-15 VITALS — BP 120/80 | HR 72 | Resp 16 | Ht 64.0 in | Wt 310.0 lb

## 2012-10-15 DIAGNOSIS — Z01419 Encounter for gynecological examination (general) (routine) without abnormal findings: Secondary | ICD-10-CM

## 2012-10-15 DIAGNOSIS — Z0000 Encounter for general adult medical examination without abnormal findings: Secondary | ICD-10-CM

## 2012-10-15 LAB — POCT URINALYSIS DIPSTICK
Blood, UA: NEGATIVE
Glucose, UA: NEGATIVE
Nitrite, UA: NEGATIVE
Protein, UA: NEGATIVE
Urobilinogen, UA: NEGATIVE

## 2012-10-15 NOTE — Patient Instructions (Signed)

## 2012-10-15 NOTE — Progress Notes (Signed)
Natalie Harding 52 y.o. [id number] Married African American Fe here for annual exam. Periods still regular and normal amount 2-3 days. No missed periods, but this one is late, only a few days. No hot flashes or night sweats.Complaining of hip pain of and on. Saw PCP no issues noted. Sees PCP for aex and labs. No other health issues today.  Patient's last menstrual period was 08/29/2012.          Sexually active: yes  The current method of family planning is tubal ligation.    Exercising: no  exercise Smoker:  no  Health Maintenance: Pap:  09-10-11 neg HPV HR neg MMG: 07-12-12 normal Colonoscopy:  2012 neg. 5 years BMD:   2012 TDaP:  1/07 Labs: Poct urine-neg Self breast exam: done occ   reports that she has quit smoking. She has never used smokeless tobacco. She reports that she drinks about 1.0 ounces of alcohol per week. She reports that she does not use illicit drugs.  Past Medical History  Diagnosis Date  . History of hepatitis C 2006  . HSV-2 infection   . History of abnormal Pap smear 1999    ASCUS, no colpo  . STD (sexually transmitted disease)     H/O HSV 2  . Hypertension     per pt no    Past Surgical History  Procedure Laterality Date  . Laparoscopic gastric banding  08/01/10  . Cesarean section  1990  . Tubal ligation Bilateral 1996  . Ankle fracture surgery Left 1987    with pins  . Cholecystectomy  2001  . Abdominal surgery      Current Outpatient Prescriptions  Medication Sig Dispense Refill  . ergocalciferol (VITAMIN D2) 50000 UNITS capsule Take 50,000 Units by mouth once a week.        . hydrochlorothiazide (HYDRODIURIL) 25 MG tablet Take 25 mg by mouth as needed.       . Ranitidine HCl (ZANTAC PO) Take by mouth daily.       No current facility-administered medications for this visit.    Family History  Problem Relation Age of Onset  . Hypertension Sister   . Breast cancer Maternal Aunt   . Multiple births Maternal Aunt     ROS:  Pertinent items are noted in  HPI.  Otherwise, a comprehensive ROS was negative.  Exam:   BP 120/80  Pulse 72  Resp 16  Ht 5\' 4"  (1.626 m)  Wt 310 lb (140.615 kg)  BMI 53.19 kg/m2  LMP 08/29/2012 Height: 5\' 4"  (162.6 cm)  Ht Readings from Last 3 Encounters:  10/15/12 5\' 4"  (1.626 m)  06/09/12 5\' 4"  (1.626 m)  04/05/11 5\' 5"  (1.651 m)    General appearance: alert, cooperative and appears stated age Head: Normocephalic, without obvious abnormality, atraumatic Neck: no adenopathy, supple, symmetrical, trachea midline and thyroid normal to inspection and palpation Lungs: clear to auscultation bilaterally Breasts: normal appearance, no masses or tenderness, No nipple retraction or dimpling, No nipple discharge or bleeding, No axillary or supraclavicular adenopathy Heart: regular rate and rhythm Abdomen: soft, non-tender; no masses,  no organomegaly Extremities: extremities normal, atraumatic, no cyanosis or edema Skin: Skin color, texture, turgor normal. No rashes or lesions Lymph nodes: Cervical, supraclavicular, and axillary nodes normal. No abnormal inguinal nodes palpated Neurologic: Grossly normal   Pelvic: External genitalia:  no lesions              Urethra:  normal appearing urethra with no masses, tenderness or lesions  Bartholin's and Skene's: normal                 Vagina: normal appearing vagina with normal color and discharge, no lesions              Cervix: normal, non tender              Pap taken: no Bimanual Exam:  Uterus:  mid position, non tender,hard to palpate due to body habitus              Adnexa: no mass, fullness, tenderness no large masses noted, unable to feel adnexa due to body habitus               Rectovaginal: Confirms               Anus:  normal sphincter tone, no lesions  A:  Well Woman with normal exam  Morbid obesity  Limited pelvic exam due morbid habitus  History of Lap Band unable do fills due to insurance coverage   Hypertension with HCTZ  Use only, PCP  management  Hep.C no detectable virus now past treatment  Perimenopausal with normal cycles at present    P:   Reviewed Health and wellness pertinent to exam  Aware of risk with Morbid obesity and risk of com morbidities. Plans to restart Lap band use soon. PUS 2013 WNL, will need PUS in 2015 if still limited pelvic exam.  Discussed weight and unsupportive shoes can contribute to hip and back pain. Patient in flat slides. Encouraged to use supportive cushion shoes to see if helps with hip issues. If no change se orthopedic.  Continue follow up as indicated  Given menses calendar with normal and abnormal parameters.  Will call if no menses in 3 months  Pap smear as per guidelines   mammogram pap smear not taken today  counseled on breast self exam, mammography screening, menopause, adequate intake of calcium and vitamin D, diet and exercise, Kegel's exercises  return annually or prn  An After Visit Summary was printed and given to the patient.

## 2012-10-20 NOTE — Progress Notes (Signed)
Note reviewed, agree with plan.  Jeffre Enriques, MD  

## 2012-11-24 ENCOUNTER — Telehealth: Payer: Self-pay | Admitting: Emergency Medicine

## 2012-11-25 NOTE — Telephone Encounter (Signed)
Encounter opened in erroneously.

## 2012-11-28 ENCOUNTER — Telehealth: Payer: Self-pay | Admitting: Certified Nurse Midwife

## 2012-11-28 NOTE — Telephone Encounter (Signed)
Patient thinks she may have a bacterial infection but is unable to come in due to work. Please advise.  [facility]

## 2012-11-28 NOTE — Telephone Encounter (Signed)
Left message on mobile phone with entire name to advise if she feels she has a bacterial infection of any kind she should be seen in a local urgent care or ER over the weekend. I cannot assess without further information.   Called home phone with no answer.

## 2012-12-09 ENCOUNTER — Encounter: Payer: Self-pay | Admitting: Certified Nurse Midwife

## 2012-12-16 NOTE — Telephone Encounter (Signed)
Debbi routing to you for fyi. I have been unable to reach patient.

## 2012-12-16 NOTE — Telephone Encounter (Signed)
Message left to return call to [place] at [phone]. Advised I was following up on previous message and can call back if has further needs.

## 2013-02-23 ENCOUNTER — Telehealth: Payer: Self-pay | Admitting: Certified Nurse Midwife

## 2013-02-23 NOTE — Telephone Encounter (Signed)
Please return a call to [phone] it is okay to leave a detailed message.

## 2013-08-10 ENCOUNTER — Encounter: Payer: Self-pay | Admitting: Certified Nurse Midwife

## 2013-08-10 ENCOUNTER — Ambulatory Visit (INDEPENDENT_AMBULATORY_CARE_PROVIDER_SITE_OTHER): Payer: BC Managed Care – PPO | Admitting: Certified Nurse Midwife

## 2013-08-10 VITALS — BP 138/86 | HR 72 | Resp 18 | Ht 64.0 in | Wt 312.0 lb

## 2013-08-10 DIAGNOSIS — B373 Candidiasis of vulva and vagina: Secondary | ICD-10-CM

## 2013-08-10 DIAGNOSIS — B3731 Acute candidiasis of vulva and vagina: Secondary | ICD-10-CM

## 2013-08-10 DIAGNOSIS — N390 Urinary tract infection, site not specified: Secondary | ICD-10-CM

## 2013-08-10 DIAGNOSIS — N898 Other specified noninflammatory disorders of vagina: Secondary | ICD-10-CM

## 2013-08-10 LAB — POCT URINALYSIS DIPSTICK
BILIRUBIN UA: NEGATIVE
GLUCOSE UA: NEGATIVE
Ketones, UA: NEGATIVE
NITRITE UA: NEGATIVE
Protein, UA: NEGATIVE
Urobilinogen, UA: NEGATIVE
pH, UA: 5

## 2013-08-10 MED ORDER — FLUCONAZOLE 150 MG PO TABS: ORAL_TABLET | ORAL | Status: DC

## 2013-08-10 MED ORDER — NYSTATIN-TRIAMCINOLONE 100000-0.1 UNIT/GM-% EX CREA: 1.0000 "application " | TOPICAL_CREAM | Freq: Two times a day (BID) | CUTANEOUS | Status: DC

## 2013-08-10 NOTE — Progress Notes (Signed)
Reviewed personally.  M. Suzanne Eagan Shifflett, MD.  

## 2013-08-10 NOTE — Patient Instructions (Addendum)
Monilial Vaginitis Vaginitis in a soreness, swelling and redness (inflammation) of the vagina and vulva. Monilial vaginitis is not a sexually transmitted infection. CAUSES  Yeast vaginitis is caused by yeast (candida) that is normally found in your vagina. With a yeast infection, the candida has overgrown in number to a point that upsets the chemical balance. SYMPTOMS   White, thick vaginal discharge.  Swelling, itching, redness and irritation of the vagina and possibly the lips of the vagina (vulva).  Burning or painful urination.  Painful intercourse. DIAGNOSIS  Things that may contribute to monilial vaginitis are:  Postmenopausal and virginal states.  Pregnancy.  Infections.  Being tired, sick or stressed, especially if you had monilial vaginitis in the past.  Diabetes. Good control will help lower the chance.  Birth control pills.  Tight fitting garments.  Using bubble bath, feminine sprays, douches or deodorant tampons.  Taking certain medications that kill germs (antibiotics).  Sporadic recurrence can occur if you become ill. TREATMENT  Your caregiver will give you medication.  There are several kinds of anti monilial vaginal creams and suppositories specific for monilial vaginitis. For recurrent yeast infections, use a suppository or cream in the vagina 2 times a week, or as directed.  Anti-monilial or steroid cream for the itching or irritation of the vulva may also be used. Get your caregiver's permission.  Painting the vagina with methylene blue solution may help if the monilial cream does not work.  Eating yogurt may help prevent monilial vaginitis. HOME CARE INSTRUCTIONS   Finish all medication as prescribed.  Do not have sex until treatment is completed or after your caregiver tells you it is okay.  Take warm sitz baths.  Do not douche.  Do not use tampons, especially scented ones.  Wear cotton underwear.  Avoid tight pants and panty  hose.  Tell your sexual partner that you have a yeast infection. They should go to their caregiver if they have symptoms such as mild rash or itching.  Your sexual partner should be treated as well if your infection is difficult to eliminate.  Practice safer sex. Use condoms.  Some vaginal medications cause latex condoms to fail. Vaginal medications that harm condoms are:  Cleocin cream.  Butoconazole (Femstat).  Terconazole (Terazol) vaginal suppository.  Miconazole (Monistat) (may be purchased over the counter). SEEK MEDICAL CARE IF:   You have a temperature by mouth above 102 F (38.9 C).  The infection is getting worse after 2 days of treatment.  The infection is not getting better after 3 days of treatment.  You develop blisters in or around your vagina.  You develop vaginal bleeding, and it is not your menstrual period.  You have pain when you urinate.  You develop intestinal problems.  You have pain with sexual intercourse. Document Released: 10/25/2004 Document Revised: 04/09/2011 Document Reviewed: 07/09/2008 [facility] Patient Information 2015 [place], [place]. This information is not intended to replace advice given to you by your health care provider. Make sure you discuss any questions you have with your health care provider. Asymptomatic Bacteriuria Asymptomatic bacteriuria is the presence of a large number of bacteria in your urine without the usual symptoms of burning or frequent urination. The following conditions increase the risk of asymptomatic bacteriuria:  Diabetes mellitus.  Advanced age.  Pregnancy in the first trimester.  Kidney stones.  Kidney transplants.  Leaky kidney tube valve in young children (reflux). Treatment for this condition is not needed in most people and can lead to other problems such as  too much yeast and growth of resistant bacteria. However, some people, such as pregnant women, do need treatment to prevent kidney  infection. Asymptomatic bacteriuria in pregnancy is also associated with fetal growth restriction, premature labor, and newborn death. HOME CARE INSTRUCTIONS Monitor your condition for any changes. The following actions may help to relieve any discomfort you are feeling:  Drink enough water and fluids to keep your urine clear or pale yellow. Go to the bathroom more often to keep your bladder empty.  Keep the area around your vagina and rectum clean. Wipe yourself from front to back after urinating. SEEK IMMEDIATE MEDICAL CARE IF:  You develop signs of an infection such as:  Burning with urination.  Frequency of voiding.  Back pain.  Fever.  You have blood in the urine.  You develop a fever. MAKE SURE YOU:  Understand these instructions.  Will watch your condition.  Will get help right away if you are not doing well or get worse. Document Released: 01/15/2005 Document Revised: 01/20/2013 Document Reviewed: 07/07/2012 [facility] Patient Information 2015 [place]. This information is not intended to replace advice given to you by your health care provider. Make sure you discuss any questions you have with your health care provider.

## 2013-08-10 NOTE — Progress Notes (Signed)
[age] yo married [id number] african [place] female here with complaint of vaginal symptoms of itching, burning, and increase discharge. Describes discharge as white, slight odor. Last sexual activity 4 days ago and was Uncomfortable. . Onset of symptoms 4-5 days ago. Denies new personal products or vaginal dryness. Patient perspires heavily all the time and changes underwear as needed. No STD concerns. Urinary symptoms none. Denies urinary frequency or urgency or pain. Denies blood in urine .   O:Healthy female WDWN Affect: normal, orientation x 3  Exam: Skin: warm and dry. Abdomen:soft, non tender CVAT negative bilateral Lymph node: no enlargement or tenderness Pelvic exam: External genital:slight increase pink, no scaling slight exudate, wet prep taken BUS: negative Urethra and urethral meatus non tender, no redness Vagina: copious white discharge noted. Ph:4.0   ,Wet prep taken Cervix: normal, non tender Uterus: normal, non tender, hard to palpate due body habitus Adnexa: non tender, no large masses or fullness noted hard to palpate due to body habtus   Wet Prep results: positive for yeast POCT Urine:RBC small WBC large   A:Yeast vaginitis/vulvutis R/O asymptomatic UTI   P:Discussed findings of yeast vaginitis/vulvitis and etiology. Discussed Aveeno or baking soda sitz bath for comfort. Avoid moist clothes or pads for extended period of time. If working out in gym clothes or swim suits for long periods of time change underwear or bottoms of swimsuit if possible. Olive Oil use for skin protection prior to activity can be used to external skin. Encouraged to increase water intake daily and advise if any UTI symptoms. Warning signs reviewed. Rx: Diflucan see order Rx Mycolog See order Lab: Urine micro/culture  Rv prn

## 2013-08-11 LAB — URINALYSIS, MICROSCOPIC ONLY
Casts: NONE SEEN
Crystals: NONE SEEN

## 2013-08-11 LAB — URINE CULTURE
Colony Count: NO GROWTH
ORGANISM ID, BACTERIA: NO GROWTH

## 2013-08-13 NOTE — Addendum Note (Signed)
Addended by: Verner CholLEONARD, Cabella Kimm S on: 08/13/2013 08:07 AM   Modules accepted: Orders

## 2013-10-16 ENCOUNTER — Ambulatory Visit: Payer: BC Managed Care – PPO | Admitting: Certified Nurse Midwife

## 2013-10-21 ENCOUNTER — Ambulatory Visit: Payer: BC Managed Care – PPO | Admitting: Certified Nurse Midwife

## 2013-11-03 ENCOUNTER — Ambulatory Visit (INDEPENDENT_AMBULATORY_CARE_PROVIDER_SITE_OTHER): Payer: BC Managed Care – PPO | Admitting: Certified Nurse Midwife

## 2013-11-03 ENCOUNTER — Encounter: Payer: Self-pay | Admitting: Certified Nurse Midwife

## 2013-11-03 VITALS — BP 108/80 | HR 70 | Resp 16 | Ht 67.5 in | Wt 315.0 lb

## 2013-11-03 DIAGNOSIS — Z01411 Encounter for gynecological examination (general) (routine) with abnormal findings: Secondary | ICD-10-CM

## 2013-11-03 DIAGNOSIS — B373 Candidiasis of vulva and vagina: Secondary | ICD-10-CM

## 2013-11-03 DIAGNOSIS — Z124 Encounter for screening for malignant neoplasm of cervix: Secondary | ICD-10-CM

## 2013-11-03 DIAGNOSIS — B3731 Acute candidiasis of vulva and vagina: Secondary | ICD-10-CM

## 2013-11-03 MED ORDER — TERCONAZOLE 0.4 % VA CREA: 1.0000 | TOPICAL_CREAM | Freq: Every day | VAGINAL | Status: DC

## 2013-11-03 NOTE — Patient Instructions (Signed)
EXERCISE AND DIET:  We recommended that you start or continue a regular exercise program for good health. Regular exercise means any activity that makes your heart beat faster and makes you sweat.  We recommend exercising at least 30 minutes per day at least 3 days a week, preferably 4 or 5.  We also recommend a diet low in fat and sugar.  Inactivity, poor dietary choices and obesity can cause diabetes, heart attack, stroke, and kidney damage, among others.    ALCOHOL AND SMOKING:  Women should limit their alcohol intake to no more than 7 drinks/beers/glasses of wine (combined, not each!) per week. Moderation of alcohol intake to this level decreases your risk of breast cancer and liver damage. And of course, no recreational drugs are part of a healthy lifestyle.  And absolutely no smoking or even second hand smoke. Most people know smoking can cause heart and lung diseases, but did you know it also contributes to weakening of your bones? Aging of your skin?  Yellowing of your teeth and nails?  CALCIUM AND VITAMIN D:  Adequate intake of calcium and Vitamin D are recommended.  The recommendations for exact amounts of these supplements seem to change often, but generally speaking 600 mg of calcium (either carbonate or citrate) and 800 units of Vitamin D per day seems prudent. Certain women may benefit from higher intake of Vitamin D.  If you are among these women, your doctor will have told you during your visit.    PAP SMEARS:  Pap smears, to check for cervical cancer or precancers,  have traditionally been done yearly, although recent scientific advances have shown that most women can have pap smears less often.  However, every woman still should have a physical exam from her gynecologist every year. It will include a breast check, inspection of the vulva and vagina to check for abnormal growths or skin changes, a visual exam of the cervix, and then an exam to evaluate the size and shape of the uterus and  ovaries.  And after 54 years of age, a rectal exam is indicated to check for rectal cancers. We will also provide age appropriate advice regarding health maintenance, like when you should have certain vaccines, screening for sexually transmitted diseases, bone density testing, colonoscopy, mammograms, etc.   MAMMOGRAMS:  All women over 40 years old should have a yearly mammogram. Many facilities now offer a "3D" mammogram, which may cost around $50 extra out of pocket. If possible,  we recommend you accept the option to have the 3D mammogram performed.  It both reduces the number of women who will be called back for extra views which then turn out to be normal, and it is better than the routine mammogram at detecting truly abnormal areas.    COLONOSCOPY:  Colonoscopy to screen for colon cancer is recommended for all women at age 50.  We know, you hate the idea of the prep.  We agree, BUT, having colon cancer and not knowing it is worse!!  Colon cancer so often starts as a polyp that can be seen and removed at colonscopy, which can quite literally save your life!  And if your first colonoscopy is normal and you have no family history of colon cancer, most women don't have to have it again for 10 years.  Once every ten years, you can do something that may end up saving your life, right?  We will be happy to help you get it scheduled when you are ready.    Be sure to check your insurance coverage so you understand how much it will cost.  It may be covered as a preventative service at no cost, but you should check your particular policy.     Exercise to Lose Weight Exercise and a healthy diet may help you lose weight. Your doctor may suggest specific exercises. EXERCISE IDEAS AND TIPS  Choose low-cost things you enjoy doing, such as walking, bicycling, or exercising to workout videos.  Take stairs instead of the elevator.  Walk during your lunch break.  Park your car further away from work or  school.  Go to a gym or an exercise class.  Start with 5 to 10 minutes of exercise each day. Build up to 30 minutes of exercise 4 to 6 days a week.  Wear shoes with good support and comfortable clothes.  Stretch before and after working out.  Work out until you breathe harder and your heart beats faster.  Drink extra water when you exercise.  Do not do so much that you hurt yourself, feel dizzy, or get very short of breath. Exercises that burn about 150 calories:  Running 1  miles in 15 minutes.  Playing volleyball for 45 to 60 minutes.  Washing and waxing a car for 45 to 60 minutes.  Playing touch football for 45 minutes.  Walking 1  miles in 35 minutes.  Pushing a stroller 1  miles in 30 minutes.  Playing basketball for 30 minutes.  Raking leaves for 30 minutes.  Bicycling 5 miles in 30 minutes.  Walking 2 miles in 30 minutes.  Dancing for 30 minutes.  Shoveling snow for 15 minutes.  Swimming laps for 20 minutes.  Walking up stairs for 15 minutes.  Bicycling 4 miles in 15 minutes.  Gardening for 30 to 45 minutes.  Jumping rope for 15 minutes.  Washing windows or floors for 45 to 60 minutes. Document Released: 02/17/2010 Document Revised: 04/09/2011 Document Reviewed: 02/17/2010 [employer] Patient Information 2015 [employer]. This information is not intended to replace advice given to you by your health care provider. Make sure you discuss any questions you have with your health care provider.  

## 2013-11-03 NOTE — Progress Notes (Signed)
53 y.o. [id number] Married African American Fe here for annual exam. Periods still occurring with light to scant 2-3 months and then normal of 1-2 days for the rest of the yea. Missed one period completely. Occasional hot flash with some night sweats.. Being treated for reflux with PCP. Sees PCP for aex/labs.Being monitored for borderline hypertension, with PCP. Patient unable to afford to go back to MD who did her lap band for weight loss again. Complaining of vaginal itching, no burning and slight increase vaginal discharge. No new personal products, just perspires heavier in vaginal area due to weight. No other health issues today.  Patient's last menstrual period was 10/05/2013.          Sexually active: Yes.    The current method of family planning is tubal ligation.    Exercising: No.  exercise Smoker:  no  Health Maintenance: Pap:  09-10-11 neg HPV HR neg MMG:  09-01-13 density category b, birads category :neg Colonoscopy:  2012 f/u 5 yrs BMD:   2012 TDaP:  2015 Labs: pcp Self breast exam: done occ   reports that she has quit smoking. She has never used smokeless tobacco. She reports that she drinks about .5 ounces of alcohol per week. She reports that she does not use illicit drugs.  Past Medical History  Diagnosis Date  . History of hepatitis C 2006  . HSV-2 infection   . History of abnormal Pap smear 1999    ASCUS, no colpo  . STD (sexually transmitted disease)     H/O HSV 2  . Hypertension     per pt no    Past Surgical History  Procedure Laterality Date  . Laparoscopic gastric banding  08/01/10  . Cesarean section  1990  . Tubal ligation Bilateral 1996  . Ankle fracture surgery Left 1987    with pins  . Cholecystectomy  2001  . Abdominal surgery      Current Outpatient Prescriptions  Medication Sig Dispense Refill  . meloxicam (MOBIC) 15 MG tablet daily.      . Ranitidine HCl (ZANTAC PO) Take by mouth daily.       No current facility-administered medications for  this visit.    Family History  Problem Relation Age of Onset  . Hypertension Sister   . Breast cancer Maternal Aunt   . Multiple births Maternal Aunt     ROS:  Pertinent items are noted in HPI.  Otherwise, a comprehensive ROS was negative.  Exam:   BP 108/80  Pulse 70  Resp 16  Ht 5' 7.5" (1.715 m)  Wt 315 lb (142.883 kg)  BMI 48.58 kg/m2  LMP 10/05/2013 Height: 5' 7.5" (171.5 cm)  Ht Readings from Last 3 Encounters:  11/03/13 5' 7.5" (1.715 m)  08/10/13 5\' 4"  (1.626 m)  10/15/12 5\' 4"  (1.626 m)    General appearance: alert, cooperative and appears stated age Head: Normocephalic, without obvious abnormality, atraumatic Neck: no adenopathy, supple, symmetrical, trachea midline and thyroid normal to inspection and palpation Lungs: clear to auscultation bilaterally Breasts: normal appearance, no masses or tenderness, No nipple retraction or dimpling, No nipple discharge or bleeding, No axillary or supraclavicular adenopathy, large pendulous Heart: regular rate and rhythm Abdomen: soft, non-tender; no masses,  no organomegaly Extremities: extremities normal, atraumatic, no cyanosis or edema Skin: Skin color, texture, turgor normal. No rashes or lesions Lymph nodes: Cervical, supraclavicular, and axillary nodes normal. No abnormal inguinal nodes palpated Neurologic: Grossly normal   Pelvic: External genitalia:  no lesions  Urethra:  normal appearing urethra with no masses, tenderness or lesions              Bartholin's and Skene's: normal                 Vagina: normal appearing vagina with normal color and white thick  discharge, no lesions ph 4.0 wet prep taken              Cervix: normal appearance, non tender, no lesions              Pap taken: Yes.   Bimanual Exam:  Uterus:  normal feel, no large masses noted limited due to body habitus              Adnexa: unable to palpate adnexa due to body habitus, no large masses noted, non tender..                Rectovaginal: Confirms               Anus:  normal sphincter tone, no lesions  Wet prep : positive for yeast  A:  Well Woman with normal exam  Perimenopausal with cycle changes  Morbid Obesity- Limited pelvic exam  Yeast vaginitis  P:   Reviewed health and wellness pertinent to exam  Discussed etiology of perimenopause, expectations with menses. Has menstrual calendar with normal and abnormal parameters and is aware to advise if present  Discussed limited ability to assess pelvic exam and need for PUS patient understands last one was 2 years ago Patient will be called with insurance information and scheduled  Reviewed findings of yeast vaginitis. Discussed drying well in between legs to help with prevention. No  Underwear at night to help also.  Rx Terazol 7 see order  Pap smear taken today with HPV reflex  counseled on breast self exam, mammography screening, feminine hygiene, menopause, adequate intake of calcium and vitamin D, diet and exercise  return annually or prn  An After Visit Summary was printed and given to the patient.

## 2013-11-04 NOTE — Progress Notes (Signed)
Reviewed personally.  M. Suzanne Alisia Vanengen, MD.  

## 2013-11-05 LAB — IPS PAP TEST WITH REFLEX TO HPV

## 2013-11-09 ENCOUNTER — Telehealth: Payer: Self-pay | Admitting: Certified Nurse Midwife

## 2013-11-09 NOTE — Telephone Encounter (Signed)
Left message for patient to call back. Need to go over benefits and schedule PUS °

## 2013-11-11 NOTE — Telephone Encounter (Signed)
Patient will call Sabrina back later.

## 2013-11-12 NOTE — Telephone Encounter (Signed)
Patient is interested in an appointment Nov.9th for her PUS appointment. Patient says you can leave details on her voicemail.

## 2013-11-13 NOTE — Telephone Encounter (Signed)
Left message for patient to call back. Advised in message that we do not schedule ultrasounds on Mondays, only on Tuesday afternoons and on Thursday (both morning and afternoon appts available).

## 2013-11-17 NOTE — Telephone Encounter (Signed)
Left message for patient to call back  

## 2013-11-18 NOTE — Telephone Encounter (Signed)
Pt is calling sabrina back °

## 2013-11-19 NOTE — Telephone Encounter (Signed)
Spoke with patient. Patient would like to schedule ultrasound appointment at this time. Requesting appointment for next Thursday afternoon. Appointment scheduled for 10/29 at 2pm with 2:30pm consult with Dr.Miller. Patient is agreeable to date and time.   Cc: Natalie Harding to go over precert information as I was unable to find note about cost  Routing to provider for final review. Patient agreeable to disposition. Will close encounter

## 2013-11-24 ENCOUNTER — Telehealth: Payer: Self-pay

## 2013-11-24 ENCOUNTER — Ambulatory Visit: Payer: BC Managed Care – PPO | Admitting: Nurse Practitioner

## 2013-11-24 NOTE — Telephone Encounter (Signed)
Left message for patient to call back. Need to go over cost information for PUS that is scheduled for 10.29.2015.

## 2013-11-24 NOTE — Telephone Encounter (Signed)
Picked up call after patient spoke with Natalie Harding about cancelling ultrasound appointment on 10/29 at 2pm with Natalie Harding. Patient would like to schedule an appointment to see Natalie Harding for a possible yeast infection. Appointment scheduled for Harding at 12:45pm with Natalie Harding. Patient is agreeable to date and time.  Cc: Natalie Harding  Routing to provider for final review. Patient agreeable to disposition. Will close encounter

## 2013-11-24 NOTE — Telephone Encounter (Signed)
Spoke with patient. Advised that per benefit quote received, she will be responsible to pay $465.38 when she comes in for PUS. Patient cancelled PUS appt due to cost. Opted not to reschedule.

## 2013-11-25 ENCOUNTER — Ambulatory Visit (INDEPENDENT_AMBULATORY_CARE_PROVIDER_SITE_OTHER): Payer: BC Managed Care – PPO | Admitting: Certified Nurse Midwife

## 2013-11-25 ENCOUNTER — Encounter: Payer: Self-pay | Admitting: Certified Nurse Midwife

## 2013-11-25 VITALS — BP 108/70 | HR 70 | Resp 16 | Ht 67.5 in | Wt 314.0 lb

## 2013-11-25 DIAGNOSIS — N760 Acute vaginitis: Secondary | ICD-10-CM

## 2013-11-25 DIAGNOSIS — B9689 Other specified bacterial agents as the cause of diseases classified elsewhere: Secondary | ICD-10-CM

## 2013-11-25 DIAGNOSIS — A499 Bacterial infection, unspecified: Secondary | ICD-10-CM

## 2013-11-25 MED ORDER — METRONIDAZOLE 0.75 % VA GEL: 1.0000 | Freq: Two times a day (BID) | VAGINAL | Status: DC

## 2013-11-25 NOTE — Patient Instructions (Signed)
Bacterial Vaginosis Bacterial vaginosis is a vaginal infection that occurs when the normal balance of bacteria in the vagina is disrupted. It results from an overgrowth of certain bacteria. This is the most common vaginal infection in women of childbearing age. Treatment is important to prevent complications, especially in pregnant women, as it can cause a premature delivery. CAUSES  Bacterial vaginosis is caused by an increase in harmful bacteria that are normally present in smaller amounts in the vagina. Several different kinds of bacteria can cause bacterial vaginosis. However, the reason that the condition develops is not fully understood. RISK FACTORS Certain activities or behaviors can put you at an increased risk of developing bacterial vaginosis, including:  Having a new sex partner or multiple sex partners.  Douching.  Using an intrauterine device (IUD) for contraception. Women do not get bacterial vaginosis from toilet seats, bedding, swimming pools, or contact with objects around them. SIGNS AND SYMPTOMS  Some women with bacterial vaginosis have no signs or symptoms. Common symptoms include:  Grey vaginal discharge.  A fishlike odor with discharge, especially after sexual intercourse.  Itching or burning of the vagina and vulva.  Burning or pain with urination. DIAGNOSIS  Your health care provider will take a medical history and examine the vagina for signs of bacterial vaginosis. A sample of vaginal fluid may be taken. Your health care provider will look at this sample under a microscope to check for bacteria and abnormal cells. A vaginal pH test may also be done.  TREATMENT  Bacterial vaginosis may be treated with antibiotic medicines. These may be given in the form of a pill or a vaginal cream. A second round of antibiotics may be prescribed if the condition comes back after treatment.  HOME CARE INSTRUCTIONS   Only take over-the-counter or prescription medicines as  directed by your health care provider.  If antibiotic medicine was prescribed, take it as directed. Make sure you finish it even if you start to feel better.  Do not have sex until treatment is completed.  Tell all sexual partners that you have a vaginal infection. They should see their health care provider and be treated if they have problems, such as a mild rash or itching.  Practice safe sex by using condoms and only having one sex partner. SEEK MEDICAL CARE IF:   Your symptoms are not improving after 3 days of treatment.  You have increased discharge or pain.  You have a fever. MAKE SURE YOU:   Understand these instructions.  Will watch your condition.  Will get help right away if you are not doing well or get worse. FOR MORE INFORMATION  Centers for Disease Control and Prevention, Division of STD Prevention: www.cdc.gov/std American Sexual Health Association (ASHA): www.ashastd.org  Document Released: 01/15/2005 Document Revised: 11/05/2012 Document Reviewed: 08/27/2012 ExitCare Patient Information 2015 [employer]. This information is not intended to replace advice given to you by your health care provider. Make sure you discuss any questions you have with your health care provider.  

## 2013-11-25 NOTE — Progress Notes (Signed)
53 y.o. married african american female [phone] here with complaint of vaginal symptoms of odor. Describes discharge as no change. Just finished period one week ago and then started noticing vaginal odor 4 days ago and pain with sexual activity. No thong use.. Denies new personal products or vaginal dryness. Patient and spouse were treated for Trichomonas 3 weeks ago and had other STD screening which was negative per patient. No  STD concerns today. Urinary symptoms none .   O:Healthy female WDWN Affect: normal, orientation x 3  Exam: Abdomen: Lymph node: no enlargement or tenderness Pelvic exam: External genital:  BUS: negative Vagina: grey watery discharge noted. Ph: 5.5  ,Wet prep taken Cervix: normal, non tender, no CMT Uterus: normal, non tender limited palpation due to body habitus Adnexa:, no large masses or fullness noted unable to palpate adnexal due to body habitus, patient had scheduled PUS for confirmation of pelvic exam but can not afford at this time, has not met deductible.   Wet Prep results: clue cells   A:BV Morbid obesity, limited pelvic exam   P:Discussed findings of BV and etiology. Discussed Aveeno or baking soda sitz bath for comfort. Avoid moist clothes for long periods of time or change underwear If STD concerns occur, come in to be checked.  Rx: Metrogel see order  Discussed calling when deductible met to schedule.  Rv prn

## 2013-11-26 ENCOUNTER — Other Ambulatory Visit: Payer: BC Managed Care – PPO

## 2013-11-26 ENCOUNTER — Other Ambulatory Visit: Payer: BC Managed Care – PPO | Admitting: Obstetrics & Gynecology

## 2013-11-26 NOTE — Progress Notes (Signed)
Reviewed personally.  M. Suzanne Ionna Avis, MD.  

## 2013-11-30 ENCOUNTER — Encounter: Payer: Self-pay | Admitting: Certified Nurse Midwife

## 2014-03-18 ENCOUNTER — Ambulatory Visit (INDEPENDENT_AMBULATORY_CARE_PROVIDER_SITE_OTHER): Payer: Self-pay | Admitting: Certified Nurse Midwife

## 2014-03-18 ENCOUNTER — Encounter: Payer: Self-pay | Admitting: Certified Nurse Midwife

## 2014-03-18 ENCOUNTER — Telehealth: Payer: Self-pay | Admitting: Certified Nurse Midwife

## 2014-03-18 VITALS — BP 120/72 | HR 72 | Temp 97.8°F | Resp 16 | Ht 67.5 in | Wt 322.0 lb

## 2014-03-18 DIAGNOSIS — N390 Urinary tract infection, site not specified: Secondary | ICD-10-CM

## 2014-03-18 DIAGNOSIS — B3731 Acute candidiasis of vulva and vagina: Secondary | ICD-10-CM

## 2014-03-18 DIAGNOSIS — B373 Candidiasis of vulva and vagina: Secondary | ICD-10-CM

## 2014-03-18 DIAGNOSIS — R350 Frequency of micturition: Secondary | ICD-10-CM

## 2014-03-18 LAB — POCT URINALYSIS DIPSTICK
BILIRUBIN UA: NEGATIVE
Glucose, UA: NEGATIVE
KETONES UA: NEGATIVE
Nitrite, UA: POSITIVE
PH UA: 5
Protein, UA: NEGATIVE
Urobilinogen, UA: NEGATIVE

## 2014-03-18 MED ORDER — NITROFURANTOIN MONOHYD MACRO 100 MG PO CAPS: 100.0000 mg | ORAL_CAPSULE | Freq: Two times a day (BID) | ORAL | Status: DC

## 2014-03-18 MED ORDER — FLUCONAZOLE 150 MG PO TABS: ORAL_TABLET | ORAL | Status: DC

## 2014-03-18 NOTE — Progress Notes (Addendum)
54 y.o. married african american female [phone] [id number] here with complaint of UTI, with onset  on 2 weeks ago.. Patient complaining of urinary frequency/urgency. Urine has odor that patient has noted. Patient denies fever, chills, nausea or back pain. No new personal products. Patient feels not related to sexual activity.  Patient is also having vaginal burning when urine touches skin and increase discharge.   Menopausal with no vaginal dryness.Patient has increased fluids hoping this would go away.   O: Healthy female WDWN Affect: Normal, orientation x 3 Skin : warm and dry CVAT: negative bilateral Abdomen: positive for suprapubic tenderness, soft  Pelvic exam: External genital area: normal, no lesions Bladder,Urethra, Urethral meatus: tender Vagina: white thick vaginal discharge, normal appearance, ph 4.0  Wet prep taken positive for yeast Cervix: normal, non tender Uterus:normal,non tender hard to palpate due to body habitus Adnexa: adnexal not palpable due to body habitus, no fullness or large masses  Poct urine-wbc 1+, nitirite positive, rbc tr  A: UTI Yeast vaginitis P: Reviewed findings of UTI and yeast vaginitis and etiology. Discussed importance of increase water intake and completed medication as ordered. XL:[id number]:Macrobid see order OZD:GUYQILab:Urine micro, culture Reviewed warning signs and symptoms of UTI Encouraged to limit soda, tea, and coffee Discussed comfort measures with Aveeno or baking soda bath sitz bath. Questions addressed. Avoid wiping area vigorously to decrease irritation. Rx Diflucan see order with instructions   RV prn

## 2014-03-18 NOTE — Telephone Encounter (Signed)
Pt has a yeast infection and would like to come in today.

## 2014-03-18 NOTE — Patient Instructions (Signed)
Monilial Vaginitis Vaginitis in a soreness, swelling and redness (inflammation) of the vagina and vulva. Monilial vaginitis is not a sexually transmitted infection. CAUSES  Yeast vaginitis is caused by yeast (candida) that is normally found in your vagina. With a yeast infection, the candida has overgrown in number to a point that upsets the chemical balance. SYMPTOMS   White, thick vaginal discharge.  Swelling, itching, redness and irritation of the vagina and possibly the lips of the vagina (vulva).  Burning or painful urination.  Painful intercourse. DIAGNOSIS  Things that may contribute to monilial vaginitis are:  Postmenopausal and virginal states.  Pregnancy.  Infections.  Being tired, sick or stressed, especially if you had monilial vaginitis in the past.  Diabetes. Good control will help lower the chance.  Birth control pills.  Tight fitting garments.  Using bubble bath, feminine sprays, douches or deodorant tampons.  Taking certain medications that kill germs (antibiotics).  Sporadic recurrence can occur if you become ill. TREATMENT  Your caregiver will give you medication.  There are several kinds of anti monilial vaginal creams and suppositories specific for monilial vaginitis. For recurrent yeast infections, use a suppository or cream in the vagina 2 times a week, or as directed.  Anti-monilial or steroid cream for the itching or irritation of the vulva may also be used. Get your caregiver's permission.  Painting the vagina with methylene blue solution may help if the monilial cream does not work.  Eating yogurt may help prevent monilial vaginitis. HOME CARE INSTRUCTIONS   Finish all medication as prescribed.  Do not have sex until treatment is completed or after your caregiver tells you it is okay.  Take warm sitz baths.  Do not douche.  Do not use tampons, especially scented ones.  Wear cotton underwear.  Avoid tight pants and panty  hose.  Tell your sexual partner that you have a yeast infection. They should go to their caregiver if they have symptoms such as mild rash or itching.  Your sexual partner should be treated as well if your infection is difficult to eliminate.  Practice safer sex. Use condoms.  Some vaginal medications cause latex condoms to fail. Vaginal medications that harm condoms are:  Cleocin cream.  Butoconazole (Femstat).  Terconazole (Terazol) vaginal suppository.  Miconazole (Monistat) (may be purchased over the counter). SEEK MEDICAL CARE IF:   You have a temperature by mouth above 102 F (38.9 C).  The infection is getting worse after 2 days of treatment.  The infection is not getting better after 3 days of treatment.  You develop blisters in or around your vagina.  You develop vaginal bleeding, and it is not your menstrual period.  You have pain when you urinate.  You develop intestinal problems.  You have pain with sexual intercourse. Document Released: 10/25/2004 Document Revised: 04/09/2011 Document Reviewed: 07/09/2008 ExitCare Patient Information 2015 [employer]. This information is not intended to replace advice given to you by your health care provider. Make sure you discuss any questions you have with your health care provider. Urinary Tract Infection Urinary tract infections (UTIs) can develop anywhere along your urinary tract. Your urinary tract is your body's drainage system for removing wastes and extra water. Your urinary tract includes two kidneys, two ureters, a bladder, and a urethra. Your kidneys are a pair of bean-shaped organs. Each kidney is about the size of your fist. They are located below your ribs, one on each side of your spine. CAUSES Infections are caused by microbes, which are   microscopic organisms, including fungi, viruses, and bacteria. These organisms are so small that they can only be seen through a microscope. Bacteria are the microbes that  most commonly cause UTIs. SYMPTOMS  Symptoms of UTIs may vary by age and gender of the patient and by the location of the infection. Symptoms in young women typically include a frequent and intense urge to urinate and a painful, burning feeling in the bladder or urethra during urination. Older women and men are more likely to be tired, shaky, and weak and have muscle aches and abdominal pain. A fever may mean the infection is in your kidneys. Other symptoms of a kidney infection include pain in your back or sides below the ribs, nausea, and vomiting. DIAGNOSIS To diagnose a UTI, your caregiver will ask you about your symptoms. Your caregiver also will ask to provide a urine sample. The urine sample will be tested for bacteria and white blood cells. White blood cells are made by your body to help fight infection. TREATMENT  Typically, UTIs can be treated with medication. Because most UTIs are caused by a bacterial infection, they usually can be treated with the use of antibiotics. The choice of antibiotic and length of treatment depend on your symptoms and the type of bacteria causing your infection. HOME CARE INSTRUCTIONS  If you were prescribed antibiotics, take them exactly as your caregiver instructs you. Finish the medication even if you feel better after you have only taken some of the medication.  Drink enough water and fluids to keep your urine clear or pale yellow.  Avoid caffeine, tea, and carbonated beverages. They tend to irritate your bladder.  Empty your bladder often. Avoid holding urine for long periods of time.  Empty your bladder before and after sexual intercourse.  After a bowel movement, women should cleanse from front to back. Use each tissue only once. SEEK MEDICAL CARE IF:   You have back pain.  You develop a fever.  Your symptoms do not begin to resolve within 3 days. SEEK IMMEDIATE MEDICAL CARE IF:   You have severe back pain or lower abdominal pain.  You  develop chills.  You have nausea or vomiting.  You have continued burning or discomfort with urination. MAKE SURE YOU:   Understand these instructions.  Will watch your condition.  Will get help right away if you are not doing well or get worse. Document Released: 10/25/2004 Document Revised: 07/17/2011 Document Reviewed: 02/23/2011 ExitCare Patient Information 2015 [employer]. This information is not intended to replace advice given to you by your health care provider. Make sure you discuss any questions you have with your health care provider.  

## 2014-03-18 NOTE — Telephone Encounter (Signed)
Spoke with patient. Patient states "I think I have a yeast infection that I want to get checked out." Appointment scheduled for today at 12:45pm with Verner Choleborah S. Leonard CNM. Agreeable to date and time.  Routing to provider for final review. Patient agreeable to disposition. Will close encounter

## 2014-03-19 LAB — URINALYSIS, MICROSCOPIC ONLY
CASTS: NONE SEEN
CRYSTALS: NONE SEEN

## 2014-03-20 LAB — URINE CULTURE: Colony Count: >=100000

## 2014-03-21 ENCOUNTER — Other Ambulatory Visit: Payer: Self-pay | Admitting: Certified Nurse Midwife

## 2014-03-21 DIAGNOSIS — B3749 Other urogenital candidiasis: Secondary | ICD-10-CM

## 2014-03-21 DIAGNOSIS — N390 Urinary tract infection, site not specified: Secondary | ICD-10-CM

## 2014-03-21 NOTE — Progress Notes (Signed)
Reviewed personally.  M. Suzanne Ilya Ess, MD.  

## 2014-03-22 ENCOUNTER — Telehealth: Payer: Self-pay | Admitting: Certified Nurse Midwife

## 2014-03-22 ENCOUNTER — Other Ambulatory Visit: Payer: Self-pay | Admitting: Certified Nurse Midwife

## 2014-03-22 DIAGNOSIS — N390 Urinary tract infection, site not specified: Secondary | ICD-10-CM

## 2014-03-22 MED ORDER — CIPROFLOXACIN HCL 500 MG PO TABS: 500.0000 mg | ORAL_TABLET | Freq: Two times a day (BID) | ORAL | Status: DC

## 2014-03-22 NOTE — Telephone Encounter (Signed)
Spoke with patient. Advised rx has been changed to Cipro 500mg  BID for 5 days. Patient is agreeable and verbalizes understanding. TOC appointment scheduled for 3/18 at 8:30am. Patient is agreeable to date and time.  Routing to provider for final review. Patient agreeable to disposition. Will close encounter

## 2014-03-22 NOTE — Telephone Encounter (Signed)
Please call patient will need to switch to Cipro at this point.  Rx In

## 2014-03-22 NOTE — Telephone Encounter (Signed)
Notes Recorded by Verner Choleborah S Leonard, CNM on 03/21/2014 at 6:54 PM Notify patient that urine micro was positive and culture positive for E. Coli, Macrobid Indertiminate for E coli Patient status? If resolving will not change antibiotic, I think she needs TOC just culture, no OV please scheduled order in

## 2014-03-22 NOTE — Telephone Encounter (Signed)
Patient calling for results and also wants to speak with nurse about her "yeast has not gone away." She declined an appointment.

## 2014-03-22 NOTE — Telephone Encounter (Signed)
Spoke with patient. Results given. Patient is agreeable. Patient states that she is still experiencing urinary frequency and urgency. "It has decreased but it is there there. I still have a white discharge as well." Patient has taken 1 diflucan at this time and is due to take the second one tomorrow as this will be day 5. Advised patient can take up to one week after completion of second tablet so vagina to regain ph. Advised to continue with Aveeno Sitz bath and take second diflucan tomorrow. Patient is agreeable. Advised will speak with Verner Choleborah S. Leonard CNM regarding antibiotic and return call.

## 2014-03-24 ENCOUNTER — Telehealth: Payer: Self-pay | Admitting: Certified Nurse Midwife

## 2014-03-24 MED ORDER — SULFAMETHOXAZOLE-TRIMETHOPRIM 800-160 MG PO TABS: 1.0000 | ORAL_TABLET | Freq: Two times a day (BID) | ORAL | Status: DC

## 2014-03-24 NOTE — Telephone Encounter (Signed)
Patient believes she had an adverse reaction to the antibiotic prescribed earlier this week.

## 2014-03-24 NOTE — Telephone Encounter (Signed)
Stop Cipro.  Take benadryl if swelling persists.   Start Bactrim DS 1 po bid for 7 days.  Move test of cure to 10 days from now.

## 2014-03-24 NOTE — Telephone Encounter (Signed)
Spoke with patient. Patient states that she took one Cipro 500 tablet on Monday night. One hour after patient had swelling of her lip and two spots on her face. Patient states that the areas went away after a couple of hours. Patient did not continue taking Cipro. Restarted taking Macrobid 100mg  bid. Patient states that she is still having some increased urgency with urination but no pain, frequency, back pain, fever, or chills. Patient has TOC appointment scheduled on 3/18 at 8:30am. Advised patient will need to speak with Dr.Silva and return call with further recommendations for patient. Patient is agreeable and verbalizes understanding.  Dr.Silva please see telephone encounter from 2/22. Please advise. Should patient continue with Macrobid at this time? Would you like me to place an allergy alert in chart for Cipro?

## 2014-03-24 NOTE — Telephone Encounter (Signed)
Left detailed message at number provided [phone]. Advised patient of message as seen below. Advised to please call back with any questions [facility] 1po bid [facility][phone] to [facility] on [facility]. Will call to make sure patient received message and [facility]

## 2014-03-25 NOTE — Telephone Encounter (Signed)
Left message to call Kaitlyn at [phone]. 

## 2014-03-25 NOTE — Telephone Encounter (Signed)
Spoke with patient. Advised patient of message as seen below from Dr.Silva. Patient is agreeable. Will stop other medication and start Bactrim at this time. TOC appointment scheduled for 04/12/2014 at 4pm. Patient is agreeable to date and time.  Routing to provider for final review. Patient agreeable to disposition. Will close encounter

## 2014-03-26 ENCOUNTER — Telehealth: Payer: Self-pay | Admitting: Certified Nurse Midwife

## 2014-03-26 NOTE — Telephone Encounter (Signed)
[facility] She states it is in reference to previous call.

## 2014-03-26 NOTE — Telephone Encounter (Signed)
Spoke with patient. Advised I have spoken with Natalie Harding CNM who recommends that patient keep appointment for follow up. Patient is agreeable and will keep appointment for 3/1 at 11:15am.  Routing to provider for final review. Patient agreeable to disposition. Will close encounter

## 2014-03-26 NOTE — Telephone Encounter (Signed)
Spoke with patient. Patient states that she has picked up bactrim rx as prescribed by Dr.Silva. Please see telephone encounter from 2/24. Patient states that she is now having vaginal itching, burning, and odor. "It is the exact same symptoms I have had with a bacterial infection. She usually gives me a prescription for Metrogel. Could all of these medications cause me to have a bacterial infection?" Advised patient was evaluated for yeast and bacteria at appointment on 2/18 with Verner Choleborah S. Leonard CNM. Patient was treated for yeast. Advised may have developed a bacterial infection since last seen but will need to be seen for evaluation. Patient is requesting rx be called in. Advised she will need an appointment as these are new symptoms. Appointment scheduled for March 1st at 11:15am with Verner Choleborah S. Leonard CNM. Patient is agreeable to date and time. Advised patient is Verner Choleborah S. Leonard CNM has any further recommendations I will return the call. Patient is agreeable.  Verner Choleborah S. Leonard CNM anything further for this patient?

## 2014-03-30 ENCOUNTER — Ambulatory Visit (INDEPENDENT_AMBULATORY_CARE_PROVIDER_SITE_OTHER): Payer: Self-pay | Admitting: Certified Nurse Midwife

## 2014-03-30 ENCOUNTER — Encounter: Payer: Self-pay | Admitting: Certified Nurse Midwife

## 2014-03-30 VITALS — BP 120/80 | HR 68 | Resp 16 | Ht 67.5 in | Wt 316.0 lb

## 2014-03-30 DIAGNOSIS — B373 Candidiasis of vulva and vagina: Secondary | ICD-10-CM

## 2014-03-30 DIAGNOSIS — B3731 Acute candidiasis of vulva and vagina: Secondary | ICD-10-CM

## 2014-03-30 MED ORDER — FLUCONAZOLE 100 MG PO TABS: 100.0000 mg | ORAL_TABLET | Freq: Every day | ORAL | Status: DC

## 2014-03-30 MED ORDER — FLUCONAZOLE 150 MG PO TABS: ORAL_TABLET | ORAL | Status: DC

## 2014-03-30 MED ORDER — NYSTATIN 100000 UNIT/GM EX CREA: 1.0000 "application " | TOPICAL_CREAM | Freq: Two times a day (BID) | CUTANEOUS | Status: DC

## 2014-03-30 NOTE — Patient Instructions (Signed)

## 2014-03-30 NOTE — Progress Notes (Signed)
54 y.o. married african american female [phone] here with complaint of vaginal symptoms of itching, burning, and increase discharge. Describes discharge as yellow slightly thick with musty odor.Onset of symptoms when switched to Bactrim antibiotic 5  days ago. Denies new personal products. Patient has been treated for UTI with Macrobid and changed to Cipro which she had facial swelling and slight skin outbreak, but took with Macrobid , she stopped Cipro and completed Macrobid and then started the Bactrim.On Bactrim now without problems, UTI symptoms are resolving.no STD concerns.  Has 3 more days to complete of Bactrim. Denies back pain, fever, chills or pain with urination. Marland Kitchen.   O:Healthy female WDWN Affect: normal, orientation x 3  Exam:Skin warm and dry CVAT negative bilateral Abdomen:non tender, negative suprapubic Lymph node: no enlargement or tenderness Pelvic exam: External genital: increased redness with scaling, wet prep taken BUS: negative Bladder and urethral meatus non tender Vagina: white/yellow thick  discharge noted. Ph:4.0  ,Wet prep taken Cervix: normal, non tender   Wet Prep results: positive for yeast   A:UTI under treatment Yeast vaginitis and vulvitis   P:Instructed to complete medication Bactrim as instructions and return for TOC culture on 04-12-14 per her appointment in epic.Increase water intake and decrease tea intake Discussed findings of yeast vaginitis/vulvitis and etiology. Discussed Aveeno or baking soda sitz bath for comfort. Avoid moist clothes or pads for extended period of time.  Rx: Nystatin cream see order Rx Diflucan see order with instructions Patient voided understanding of instructions  Rv prn

## 2014-03-30 NOTE — Addendum Note (Signed)
Addended by: Verner CholLEONARD, Shaton Lore S on: 03/30/2014 12:18 PM   Modules accepted: Orders

## 2014-03-30 NOTE — Progress Notes (Signed)
Notation already completed.

## 2014-04-06 NOTE — Progress Notes (Signed)
Reviewed personally.  M. Suzanne Robinn Overholt, MD.  

## 2014-04-12 ENCOUNTER — Ambulatory Visit (INDEPENDENT_AMBULATORY_CARE_PROVIDER_SITE_OTHER): Payer: Self-pay | Admitting: *Deleted

## 2014-04-12 DIAGNOSIS — N390 Urinary tract infection, site not specified: Secondary | ICD-10-CM

## 2014-04-12 NOTE — Progress Notes (Signed)
Patient in today for TOC. Patient states she finished abx and states she has no noticeable Sx.   - Advised pt to call if any sx reoccur. - Pt agreed.

## 2014-04-13 ENCOUNTER — Telehealth: Payer: Self-pay | Admitting: Certified Nurse Midwife

## 2014-04-13 LAB — URINALYSIS, MICROSCOPIC ONLY
BACTERIA UA: NONE SEEN
CASTS: NONE SEEN
Crystals: NONE SEEN

## 2014-04-13 NOTE — Telephone Encounter (Signed)
Patient was given a prescription for a yeast infection 03/30/14. Patient just picked up the prescription and is unsure about the directions.

## 2014-04-13 NOTE — Telephone Encounter (Signed)
Spoke with patient. Patient states that she was seen on 3/1 with Verner Choleborah S. Leonard CNM for a yeast infection. Was prescribed diflucan 150mg  #2 0RF. Patient took one Diflucan 150mg  tablet. Went to the pharmacy today and had another prescription for Diflucan 100mg  #30 0RF. "I picked up the prescription but I am not sure if I need it. That is a lot to take." Advised patient per note in EPIC the prescription for Diflucan 100mg  #30 0RF was en entry error and the correct rx was sent in. Advised pharmacy may have filled both prescriptions. Advised does not need to take the Diflucan 100mg  #30 one tablet daily. Patient denies any current symptoms. Advised will speak with Verner Choleborah S. Leonard CNM to verify and return call if there are any further recommendations. Patient is agreeable.

## 2014-04-13 NOTE — Telephone Encounter (Signed)
She should have only had enough for 2 days. This had to have been an entry error.

## 2014-04-14 LAB — URINE CULTURE

## 2014-04-14 NOTE — Telephone Encounter (Signed)
Spoke with patient. Advised of message as seen below from Verner Choleborah S. Leonard CNM. Patient is agreeable. Patient asking about urine culture results. Results given as seen below. Patient is agreeable and verbalizes understanding.  Notes Recorded by Verner Choleborah S Leonard, CNM on 04/14/2014 at 7:47 AM Notify patient urine micro negative now and culture multiple bacteria no predominant or need for treatment. UTI resolved. Would suggest cranberry capsule OTC once daily to improve bladder health for the next 2-3 months.  Routing to provider for final review. Patient agreeable to disposition. Will close encounter

## 2014-04-16 ENCOUNTER — Ambulatory Visit: Payer: Self-pay

## 2014-05-12 ENCOUNTER — Telehealth: Payer: Self-pay | Admitting: Certified Nurse Midwife

## 2014-05-12 NOTE — Telephone Encounter (Signed)
Provider cs. Left message to cb and rs. Recall entered

## 2014-05-21 NOTE — Telephone Encounter (Signed)
Pt called back and scheduled

## 2014-09-30 ENCOUNTER — Other Ambulatory Visit: Payer: Self-pay | Admitting: Surgery

## 2014-09-30 DIAGNOSIS — IMO0001 Reserved for inherently not codable concepts without codable children: Secondary | ICD-10-CM

## 2014-09-30 DIAGNOSIS — K9509 Other complications of gastric band procedure: Secondary | ICD-10-CM

## 2014-09-30 DIAGNOSIS — T85518A Breakdown (mechanical) of other gastrointestinal prosthetic devices, implants and grafts, initial encounter: Principal | ICD-10-CM

## 2014-10-07 ENCOUNTER — Other Ambulatory Visit: Payer: Self-pay

## 2014-10-22 DIAGNOSIS — Z8619 Personal history of other infectious and parasitic diseases: Secondary | ICD-10-CM | POA: Insufficient documentation

## 2014-10-22 DIAGNOSIS — R7301 Impaired fasting glucose: Secondary | ICD-10-CM

## 2014-10-22 HISTORY — DX: Impaired fasting glucose: R73.01

## 2014-11-08 ENCOUNTER — Ambulatory Visit: Payer: BC Managed Care – PPO | Admitting: Certified Nurse Midwife

## 2014-11-09 ENCOUNTER — Ambulatory Visit: Payer: Self-pay | Admitting: Certified Nurse Midwife

## 2015-06-21 ENCOUNTER — Encounter: Payer: Self-pay | Admitting: Certified Nurse Midwife

## 2015-06-21 ENCOUNTER — Ambulatory Visit (INDEPENDENT_AMBULATORY_CARE_PROVIDER_SITE_OTHER): Payer: Managed Care, Other (non HMO) | Admitting: Certified Nurse Midwife

## 2015-06-21 ENCOUNTER — Other Ambulatory Visit: Payer: Self-pay | Admitting: Certified Nurse Midwife

## 2015-06-21 VITALS — BP 124/70 | HR 70 | Resp 16 | Ht 63.75 in | Wt 283.0 lb

## 2015-06-21 DIAGNOSIS — N898 Other specified noninflammatory disorders of vagina: Secondary | ICD-10-CM

## 2015-06-21 DIAGNOSIS — N938 Other specified abnormal uterine and vaginal bleeding: Secondary | ICD-10-CM

## 2015-06-21 DIAGNOSIS — N9489 Other specified conditions associated with female genital organs and menstrual cycle: Secondary | ICD-10-CM

## 2015-06-21 DIAGNOSIS — Z0000 Encounter for general adult medical examination without abnormal findings: Secondary | ICD-10-CM | POA: Diagnosis not present

## 2015-06-21 DIAGNOSIS — N951 Menopausal and female climacteric states: Secondary | ICD-10-CM

## 2015-06-21 DIAGNOSIS — Z01419 Encounter for gynecological examination (general) (routine) without abnormal findings: Secondary | ICD-10-CM | POA: Diagnosis not present

## 2015-06-21 DIAGNOSIS — Z124 Encounter for screening for malignant neoplasm of cervix: Secondary | ICD-10-CM | POA: Diagnosis not present

## 2015-06-21 LAB — POCT URINALYSIS DIPSTICK
BILIRUBIN UA: NEGATIVE
GLUCOSE UA: NEGATIVE
KETONES UA: NEGATIVE
Leukocytes, UA: NEGATIVE
Nitrite, UA: NEGATIVE
PH UA: 5
Protein, UA: NEGATIVE
RBC UA: NEGATIVE
Urobilinogen, UA: NEGATIVE

## 2015-06-21 NOTE — Progress Notes (Signed)
Reviewed personally.  M. Suzanne Ricca Melgarejo, MD.  

## 2015-06-21 NOTE — Patient Instructions (Addendum)

## 2015-06-21 NOTE — Progress Notes (Signed)
55 y.o. [id number] Married  African American Fe here for annual exam. Periods in 2016 were none in 9/16,10/16,/11/16 then spotting. Perimenopausal with bleeding 02/27/15 with spotting for 5 days.Bleeding again heavy like period 02/27/15 for 2 days. No further spotting or bleeding since then, Having hot flashes and night sweats at times, no insomnia.. Denies vaginal dryness, but slight irritation. No new personal products, odor or itching, just discharge. No urinary symptoms. Has been working on weight loss with [facility], has labs done there every 6 months and has lost 42 pounds to this point with medication management.. Sees Dr. Jean Rosenthal with [facility], aex.Marland Kitchen History of Hep C treated and last screen negative. Had full labs with bariatric clinic 1/17 . 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 Some edema of feet recently and has seen orthopedic with arthritis diagnosis. No other health issues today.  Patient's last menstrual period was 02/27/2015.          Sexually active: Yes.    The current method of family planning is tubal ligation.    Exercising: No.  exercise Smoker:  no  Health Maintenance: Pap:  11-03-13 neg no endos MMG:9/ 2016 per patient, plans to schedule Colonoscopy:  2012 f/u 56yrs, endoscopy 2016 Reflex BMD:   2012 TDaP:  2015 Shingles: no Pneumonia: no Hep C and HIV: history of hep c, tested negative after treated Labs: poct urine-neg Self breast exam: not done   reports that she has quit smoking. She has never used smokeless tobacco. She reports that she drinks about 0.6 - 1.2 oz of alcohol per week. She reports that she does not use illicit drugs.  Past Medical History  Diagnosis Date  . History of hepatitis C 2006  . HSV-2 infection   . History of abnormal Pap smear 1999    ASCUS, no colpo  . STD (sexually transmitted disease)     H/O HSV 2  . Hypertension     per pt no    Past Surgical History  Procedure Laterality Date  . Laparoscopic  gastric banding  08/01/10  . Cesarean section  1990  . Tubal ligation Bilateral 1996  . Ankle fracture surgery Left 1987    with pins  . Cholecystectomy  2001  . Abdominal surgery      Current Outpatient Prescriptions  Medication Sig Dispense Refill  . LORazepam (ATIVAN) 1 MG tablet Take 1 mg by mouth every 8 (eight) hours.    . Naltrexone-Bupropion HCl ER (CONTRAVE) 8-90 MG TB12      No current facility-administered medications for this visit.    Family History  Problem Relation Age of Onset  . Hypertension Sister   . Breast cancer Maternal Aunt   . Multiple births Maternal Aunt     ROS:  Pertinent items are noted in HPI.  Otherwise, a comprehensive ROS was negative.  Exam:   BP 124/70 mmHg  Pulse 70  Resp 16  Ht 5' 3.75" (1.619 m)  Wt 283 lb (128.368 kg)  BMI 48.97 kg/m2  LMP 02/27/2015 Height: 5' 3.75" (161.9 cm) Ht Readings from Last 3 Encounters:  06/21/15 5' 3.75" (1.619 m)  04/12/14 5' 7.5" (1.715 m)  03/30/14 5' 7.5" (1.715 m)    General appearance: alert, cooperative and appears stated age Head: Normocephalic, without obvious abnormality, atraumatic Neck: no adenopathy, supple, symmetrical, trachea midline and thyroid normal to inspection and palpation Lungs: clear to auscultation bilaterally Breasts: normal appearance, no masses or tenderness, No nipple retraction or  dimpling, No nipple discharge or bleeding, No axillary or supraclavicular adenopathy Heart: regular rate and rhythm Abdomen: soft, non-tender; no masses,  no organomegaly Extremities: extremities normal, atraumatic, no cyanosis or edema Skin: Skin color, texture, turgor normal. No rashes or lesions Lymph nodes: Cervical, supraclavicular, and axillary nodes normal. No abnormal inguinal nodes palpated Neurologic: Grossly normal   Pelvic: External genitalia:  no lesions              Urethra:  normal appearing urethra with no masses, tenderness or lesions              Bartholin's and Skene's:  normal                 Vagina: normal appearing vagina with normal color and thick non odorous discharge, no lesions affirm taken              Cervix: normal appearance, no lesions noted, non tender, bleeding with pap only              Pap taken: Yes.   Bimanual Exam:  Uterus:  normal size, contour, position, consistency, mobility, non-tender and limited by body habitus              Adnexa: normal adnexa, no mass, fullness, tenderness and adnexa not palpated due to body habitus               Rectovaginal: Confirms               Anus:  normal sphincter tone, no lesions  Chaperone present: yes  A:  Well Woman with normal exam  Contraception BTL  Limited pelvic exam due to body habitus  DUB with Menopausal symptoms  R/O Vaginal infection  Screening labs  P:   Reviewed health and wellness pertinent to exam  Discussed need for PUS for evaluation due to limited pelvic exam due to body habitus and DUB with menses. Discussed importance of monitoring bleeding with perimenopause or menopause. Discussed possible endometrial biopsy also. Patient agreeable and questions answered. Patient will be called with insurance information and scheduled. Will continue to keep menses calendar. Discussed expectations with perimenopause and bleeding profile. Given handout.  Will treat per affirm if indicated. Discussed possible some vaginal dryness, will observe and advise if issues.  Labs:FSH, affrim  Pap smear as above with HPVHR   counseled on breast self exam, mammography screening, menopause, adequate intake of calcium and vitamin D, diet and exercise  return annually or prn  An After Visit Summary was printed and given to the patient.

## 2015-06-21 NOTE — Addendum Note (Signed)
Addended by: Eliezer BottomJOHNSON, DAVINA J on: 06/21/2015 01:15 PM   Modules accepted: Orders, SmartSet

## 2015-06-22 ENCOUNTER — Telehealth: Payer: Self-pay

## 2015-06-22 ENCOUNTER — Other Ambulatory Visit: Payer: Self-pay | Admitting: Certified Nurse Midwife

## 2015-06-22 DIAGNOSIS — N760 Acute vaginitis: Principal | ICD-10-CM

## 2015-06-22 DIAGNOSIS — B9689 Other specified bacterial agents as the cause of diseases classified elsewhere: Secondary | ICD-10-CM

## 2015-06-22 LAB — WET PREP BY MOLECULAR PROBE
CANDIDA SPECIES: NEGATIVE
Gardnerella vaginalis: POSITIVE — AB
TRICHOMONAS VAG: NEGATIVE

## 2015-06-22 LAB — PROLACTIN: Prolactin: 11.4 ng/mL (ref 4.8–23.3)

## 2015-06-22 LAB — FOLLICLE STIMULATING HORMONE: FSH: 31.4 m[IU]/mL

## 2015-06-22 MED ORDER — METRONIDAZOLE 0.75 % VA GEL: VAGINAL | Status: DC

## 2015-06-22 NOTE — Telephone Encounter (Signed)
lmtcb

## 2015-06-22 NOTE — Telephone Encounter (Signed)
Patient notified of results. See lab 

## 2015-06-22 NOTE — Telephone Encounter (Signed)
-----   Message from Verner Choleborah S Leonard, CNM sent at 06/22/2015  7:58 AM EDT ----- Notify patient that Affirm was positive for BV, this would explain her feeling irritated. Order in for Metrogel please give instructions Yeast and Trichomonas negative. Has she had her labs drawn?

## 2015-06-23 ENCOUNTER — Telehealth: Payer: Self-pay

## 2015-06-23 NOTE — Telephone Encounter (Signed)
lmtcb

## 2015-06-23 NOTE — Telephone Encounter (Signed)
-----   Message from Verner Choleborah S Leonard, CNM sent at 06/22/2015  6:07 PM EDT ----- Notify [facility] men[facility] Prolactin is normal

## 2015-06-24 NOTE — Telephone Encounter (Signed)
Patient returned call and said, "What ever Joy's needs to tell me she can leave the details on my voicemail."

## 2015-06-24 NOTE — Telephone Encounter (Signed)
Patient notified of results as written by provider 

## 2015-06-30 ENCOUNTER — Telehealth: Payer: Self-pay | Admitting: Obstetrics & Gynecology

## 2015-06-30 NOTE — Telephone Encounter (Signed)
Patient states someone called her from this office this morning and there is no notes.

## 2015-06-30 NOTE — Telephone Encounter (Signed)
Per note on EMB and PUS Suzy left message to return call to discuss benefits.  Routing to WilliamsSuzy for return call to discuss with patient.

## 2015-07-01 NOTE — Telephone Encounter (Signed)
Patient returning call to Suzy. °

## 2015-07-01 NOTE — Telephone Encounter (Signed)
Called patient to review benefits for a recommended procedure. Left Voicemail requesting a call back. °

## 2015-07-04 NOTE — Telephone Encounter (Signed)
Spoke with pt regarding benefit for ultrasound and endometrial biopsy. Patient understood and agreeable. Patient ready to schedule. Patient scheduled 07/07/15 with Dr Hyacinth MeekerMiller. Pt aware of arrival date and time. Pt aware of 72 hours cancellation policy with.  No further questions. Ok to close

## 2015-07-05 ENCOUNTER — Telehealth: Payer: Self-pay

## 2015-07-05 ENCOUNTER — Other Ambulatory Visit: Payer: Self-pay | Admitting: Obstetrics & Gynecology

## 2015-07-05 DIAGNOSIS — N938 Other specified abnormal uterine and vaginal bleeding: Secondary | ICD-10-CM

## 2015-07-05 NOTE — Telephone Encounter (Signed)
Patient notified of results. See lab 

## 2015-07-05 NOTE — Telephone Encounter (Signed)
Pt called & told pap smear was ASCUS HPV HR neg. lmtcb

## 2015-07-07 ENCOUNTER — Ambulatory Visit (INDEPENDENT_AMBULATORY_CARE_PROVIDER_SITE_OTHER): Payer: Managed Care, Other (non HMO) | Admitting: Obstetrics & Gynecology

## 2015-07-07 ENCOUNTER — Encounter: Payer: Self-pay | Admitting: Obstetrics & Gynecology

## 2015-07-07 ENCOUNTER — Ambulatory Visit (INDEPENDENT_AMBULATORY_CARE_PROVIDER_SITE_OTHER): Payer: Managed Care, Other (non HMO)

## 2015-07-07 VITALS — BP 136/90 | HR 72 | Resp 18 | Wt 284.0 lb

## 2015-07-07 DIAGNOSIS — N938 Other specified abnormal uterine and vaginal bleeding: Secondary | ICD-10-CM

## 2015-07-07 DIAGNOSIS — N951 Menopausal and female climacteric states: Secondary | ICD-10-CM | POA: Diagnosis not present

## 2015-07-07 NOTE — Progress Notes (Signed)
55 y.o. [id number] Married [place] female here for pelvic ultrasound due to possible postmenopausal or perimenopausal bleeding.  Pt recently seen by Leota Sauerseborah Leonard on May 23rd.  Due to her irregular bleeding this past year, PUS with possible biopsy was advised.  FSH at that time was 31.4 and prolactin was normal.  Pt did have pap obtained as well showing ASCUS with neg HR HPV.  Has questions about this that were answered.  In last six months, pt's bleeding was heavy in January like a heavier cycle.  She's skipped several months at the end of last year.  She is actively working on weight loss with bariatric clinic at [facility].  Patient's last menstrual period was 02/27/2015.  Contraception: BTL  Findings:  UTERUS: 6.5 x 4.8 x 3.5cm without fibroids EMS:2.876mm ADNEXA: Left ovary: 2.5 x 1.7 x 2.3cm       Right ovary: 2.0 x 1.8 x 1.1cm CUL DE SAC:  No free fluid  Discussion:  Findings reviewed with pt, specifically endometrial thickness.  Given no cycles since January and [facility] (which was reviewed with her as well), do not feel endometrial biopsy is necessary.  Pt aware she may continue to have some cycling and advised her to call if/when happens.  I would also recommend [facility]  Pt comfortable with plan and voices understanding.  Assessment:  Perimenopausal bleeding with thin endometrium and FSH 31 obtained 5/17  Plan:  Follow conservatively.  Advised pt to call with bleeding.  All questions answered.    ~15 minutes spent with patient >50% of time was in face to face discussion of above.

## 2015-07-08 ENCOUNTER — Ambulatory Visit: Payer: Self-pay | Admitting: Certified Nurse Midwife

## 2015-09-18 ENCOUNTER — Emergency Department (HOSPITAL_BASED_OUTPATIENT_CLINIC_OR_DEPARTMENT_OTHER)
Admission: EM | Admit: 2015-09-18 | Discharge: 2015-09-18 | Disposition: A | Discharge status: Home / Self Care | Payer: Managed Care, Other (non HMO) | Attending: Emergency Medicine | Admitting: Emergency Medicine

## 2015-09-18 ENCOUNTER — Encounter (HOSPITAL_BASED_OUTPATIENT_CLINIC_OR_DEPARTMENT_OTHER): Payer: Self-pay | Admitting: *Deleted

## 2015-09-18 DIAGNOSIS — Z87891 Personal history of nicotine dependence: Secondary | ICD-10-CM | POA: Insufficient documentation

## 2015-09-18 DIAGNOSIS — Y929 Unspecified place or not applicable: Secondary | ICD-10-CM | POA: Diagnosis not present

## 2015-09-18 DIAGNOSIS — Z791 Long term (current) use of non-steroidal anti-inflammatories (NSAID): Secondary | ICD-10-CM | POA: Insufficient documentation

## 2015-09-18 DIAGNOSIS — Y939 Activity, unspecified: Secondary | ICD-10-CM | POA: Diagnosis not present

## 2015-09-18 DIAGNOSIS — T300 Burn of unspecified body region, unspecified degree: Secondary | ICD-10-CM

## 2015-09-18 DIAGNOSIS — Y999 Unspecified external cause status: Secondary | ICD-10-CM | POA: Diagnosis not present

## 2015-09-18 DIAGNOSIS — I10 Essential (primary) hypertension: Secondary | ICD-10-CM | POA: Insufficient documentation

## 2015-09-18 DIAGNOSIS — R05 Cough: Secondary | ICD-10-CM | POA: Diagnosis not present

## 2015-09-18 DIAGNOSIS — X100XXA Contact with hot drinks, initial encounter: Secondary | ICD-10-CM | POA: Insufficient documentation

## 2015-09-18 DIAGNOSIS — T23002A Burn of unspecified degree of left hand, unspecified site, initial encounter: Secondary | ICD-10-CM | POA: Insufficient documentation

## 2015-09-18 NOTE — ED Notes (Signed)
Unable to visualize any effect of burn on left hand. Pt states it is not as red as it was.

## 2015-09-18 NOTE — ED Provider Notes (Signed)
[facility] Provider Note   CSN: [id number] Arrival date & time: 09/18/15  1248   History   Chief Complaint Chief Complaint  Patient presents with  . Hand Burn    HPI Natalie Harding is a 55 y.o. female.  HPI   55 year old female presents today with a burn to her left hand. Patient reports she was coughing prior to arrival so cough in the hand and had redness over the dorsum. She reports at time of evaluation that she has tingling in the hand over the area that was touched by coffee, reports the redness is completely gone, denies any blistering, any other burns. No other complaints, full active range of motion of her hands.    Past Medical History:  Diagnosis Date  . History of abnormal Pap smear 1999   ASCUS, no colpo  . History of hepatitis C 2006  . HSV-2 infection   . Hypertension    per pt no  . STD (sexually transmitted disease)    H/O HSV 2    Patient Active Problem List   Diagnosis Date Noted  . Morbid obesity (HCC) 06/09/2012  . HTN (hypertension) 06/09/2012  . Hx of laparoscopic gastric banding 08/15/2010    Past Surgical History:  Procedure Laterality Date  . ABDOMINAL SURGERY    . ANKLE FRACTURE SURGERY Left 1987   with pins  . CESAREAN SECTION  1990  . CHOLECYSTECTOMY  2001  . LAPAROSCOPIC GASTRIC BANDING  08/01/10  . TUBAL LIGATION Bilateral 1996  . UPPER GI ENDOSCOPY      OB History    Gravida Para Term Preterm AB Living   4 1 1   3 1    SAB TAB Ectopic Multiple Live Births   1 2     1        Home Medications    Prior to Admission medications   Medication Sig Start Date End Date Taking? Authorizing Provider  ibuprofen (ADVIL,MOTRIN) 200 MG tablet Take by mouth.    Historical Provider, MD  LORazepam (ATIVAN) 1 MG tablet Take 1 mg by mouth every 8 (eight) hours.    Historical Provider, MD  Naltrexone-Bupropion HCl ER (CONTRAVE) 8-90 MG TB12 Reported on 07/07/2015 05/19/15   Historical Provider, MD    Family History Family  History  Problem Relation Age of Onset  . Hypertension Sister   . Breast cancer Maternal Aunt   . Multiple births Maternal Aunt     Social History Social History  Substance Use Topics  . Smoking status: Former Games developermoker  . Smokeless tobacco: Never Used  . Alcohol use 0.6 - 1.2 oz/week    1 - 2 Standard drinks or equivalent per week     Allergies   Ciprofloxacin   Review of Systems Review of Systems  All other systems reviewed and are negative.    Physical Exam Updated Vital Signs BP 171/88 (BP Location: Right Arm)   Pulse 62   Temp 97.7 F (36.5 C) (Oral)   Resp 20   Ht 5\' 4"  (1.626 m)   Wt 122.5 kg   LMP 02/27/2015   SpO2 100%   BMI 46.35 kg/m   Physical Exam  Musculoskeletal:  Bilateral hands atraumatic, no redness, warmth to touch, decreased sensation, full active range of motion     ED Treatments / Results  Labs (all labs ordered are listed, but only abnormal results are displayed) Labs Reviewed - No data to display  EKG  EKG Interpretation None  Radiology No results found.  Procedures Procedures (including critical care time)  Medications Ordered in ED Medications - No data to display   Initial Impression / Assessment and Plan / ED Course  I have reviewed the triage vital signs and the nursing notes.  Pertinent labs & imaging results that were available during my care of the patient were reviewed by me and considered in my medical decision making (see chart for details).  Clinical Course     Final Clinical Impressions(s) / ED Diagnoses   Final diagnoses:  Burn   Labs:  Imaging:  Consults:  Therapeutics:  Discharge Meds:   Assessment/Plan: Vision comes to the emergency room with complaints of burn to her hand. She has no signs of burn, no other complaints. She will be instructed to use cool packs, monitor for any acute changes, follow-up with primary care if any present. Patient verbalized understanding and agreement  to today's plan had no further questions or concerns at time of discharge     New Prescriptions Discharge Medication List as of 09/18/2015  4:06 PM       Eyvonne MechanicJeffrey Mahamed Zalewski, PA-C 09/18/15 2034    Nelva Nayobert Beaton, MD 09/18/15 2311

## 2015-09-18 NOTE — ED Triage Notes (Signed)
Pt spilled coffee on her left hand at apprx. noon today. Pt has mild redness. No blisters noted.

## 2016-02-27 ENCOUNTER — Encounter (HOSPITAL_COMMUNITY): Payer: Self-pay

## 2016-03-14 ENCOUNTER — Encounter: Payer: Self-pay | Admitting: Certified Nurse Midwife

## 2016-03-14 ENCOUNTER — Ambulatory Visit (INDEPENDENT_AMBULATORY_CARE_PROVIDER_SITE_OTHER): Payer: Managed Care, Other (non HMO) | Admitting: Certified Nurse Midwife

## 2016-03-14 VITALS — BP 132/70 | HR 68 | Resp 16 | Ht 64.25 in | Wt 284.0 lb

## 2016-03-14 DIAGNOSIS — N951 Menopausal and female climacteric states: Secondary | ICD-10-CM

## 2016-03-14 DIAGNOSIS — N912 Amenorrhea, unspecified: Secondary | ICD-10-CM

## 2016-03-14 DIAGNOSIS — B9689 Other specified bacterial agents as the cause of diseases classified elsewhere: Secondary | ICD-10-CM | POA: Diagnosis not present

## 2016-03-14 DIAGNOSIS — N760 Acute vaginitis: Secondary | ICD-10-CM | POA: Diagnosis not present

## 2016-03-14 MED ORDER — METRONIDAZOLE 0.75 % VA GEL: 1.0000 | Freq: Two times a day (BID) | VAGINAL | 0 refills | Status: DC

## 2016-03-14 NOTE — Progress Notes (Signed)
56 y.o. Married [place] female [id number] here with complaint of vaginal symptoms of itching, burning, and increase discharge. Describes discharge as watery and yellow in color. Onset of symptoms about 2 days ago. Denies new personal products or vaginal dryness. No STD concerns. Urinary symptoms none . Contraception is BTL. Has not had any bleeding since spotting on 10/26/15. PUS in 6/17 due perimenopausal bleeding all normal. Back on weight loss with Bariatric clinic now. Menopausal on hot flashes and night sweats now every day. No insomnia. Sees PCP for aex/ and recently started on Cymbalta for depression. Social stress with family. No other health issues today.  ROS: Pertinent to HPI as above  O:  Healthy female WDWN Affect: normal, orientation x 3  Exam:Skin: warm and dry Abdomen: soft, non tender  Inguinal Lymph nodes: no enlargement or tenderness Pelvic exam: External genital: normal female, no exudate or scaling or lesions BUS: negative Vagina: copious watery odorous discharge noted.   Wet Prep taken, Ph 5.0 Cervix: normal, non tender, no CMT Uterus: normal, non tender Adnexa:normal, non tender, no masses or fullness noted  Wet prep: KOH, Saline + clue  A: BV  Amenorrhea with menopausal symptoms  Depression recently on Cymbalta with PCP  Normal pelvic exam   P: Discussed findings of BV and etiology. Discussed Aveeno or baking soda sitz bath for comfort. Avoid moist clothes or pads for extended period of time.   Rx: Metrogel see order with instructions  Discussed amenorrhea and need to evaluate with labs. Patient agreeable.  Labs to [facility]: TSH,FSH,Prolactin  Discussed may need Provera challenge, and will  Notify once labs. Questions addressed.  Rv prn

## 2016-03-15 LAB — FOLLICLE STIMULATING HORMONE: FSH: 41 m[IU]/mL

## 2016-03-15 LAB — TSH: TSH: 0.858 u[IU]/mL (ref 0.450–4.500)

## 2016-03-15 LAB — PROLACTIN: PROLACTIN: 12.7 ng/mL (ref 4.8–23.3)

## 2016-03-15 NOTE — Progress Notes (Signed)
Encounter reviewed Kenyon Eichelberger, MD   

## 2016-03-16 ENCOUNTER — Telehealth: Payer: Self-pay | Admitting: *Deleted

## 2016-03-16 NOTE — Telephone Encounter (Signed)
Left message to call Noreene LarssonJill at [phone].  Notes Recorded by Verner Choleborah S Leonard, CNM on 03/16/2016 at 11:45 AM EST NOtify patient that [facility] elevated since last check which indicates perimenopause Prolactin and TSH are normal Will need Provera 10mg  X 10 days and will need to advise if bleeding or no bleeding up to two weeks after last dose. She also needs to continue to keep menses calendar.

## 2016-03-19 MED ORDER — MEDROXYPROGESTERONE ACETATE 10 MG PO TABS: 10.0000 mg | ORAL_TABLET | Freq: Every day | ORAL | 0 refills | Status: DC

## 2016-03-19 NOTE — Telephone Encounter (Signed)
Spoke with patient, advised of results and recommendations as seen below per Leota Sauerseborah Leonard, CNM. Rx for provera sent to verified pharmacy on file. Patient verbalizes understanding and is agreeable.  Routing to provider for final review. Patient is agreeable to disposition. Will close encounter.

## 2016-04-02 ENCOUNTER — Ambulatory Visit (INDEPENDENT_AMBULATORY_CARE_PROVIDER_SITE_OTHER): Payer: Managed Care, Other (non HMO) | Admitting: Nurse Practitioner

## 2016-04-02 ENCOUNTER — Telehealth: Payer: Self-pay | Admitting: Certified Nurse Midwife

## 2016-04-02 ENCOUNTER — Encounter: Payer: Self-pay | Admitting: Nurse Practitioner

## 2016-04-02 VITALS — BP 120/80 | HR 64 | Ht 64.25 in | Wt 286.0 lb

## 2016-04-02 DIAGNOSIS — N760 Acute vaginitis: Secondary | ICD-10-CM

## 2016-04-02 NOTE — Telephone Encounter (Signed)
Pt was treated with Metrogel for BV and this can cause a clear discharge.  She still needs to monitor for any vaginal bleeding and cal back.

## 2016-04-02 NOTE — Telephone Encounter (Signed)
Patient states she took the Provera that was prescribed to her but can't remember what she was supposed to do after she finished.

## 2016-04-02 NOTE — Progress Notes (Signed)
Patient ID: Natalie Harding, female   DOB: 08/05/1960, 56 y.o.   MRN: [id number]  56 y.o. Married [place] female [phone] here with complaint of vaginal symptoms of stinging internal and increase discharge. Describes discharge as thin watery.  She completed Metrogel for BV about a week ago.  She did d Provera challenge and now at 6 days without a menses. Onset of symptoms 3 days ago. Denies new personal products or vaginal dryness.  No STD concerns. Urinary symptoms none other than tingling sensation to the labia . Contraception is BTL.   O:  Healthy female WDWN Affect: normal, orientation x 3  Exam: no distress Abdomen: soft and non tender Lymph node: no enlargement or tenderness Pelvic exam: External genital: normal female BUS: negative Vagina: thin clear discharge noted.  Affirm taken. Cervix: normal, non tender, no CMT, very light pink discharge noted on swab but none from the cervix.   A: R/O recurrent Vaginitis  Recent TX for BV  Amenorrhea with Provera challenge ended last Tuesday   P: Discussed findings of vaginitis and etiology. Discussed Aveeno or baking soda sitz bath for comfort. Avoid moist clothes or pads for extended period of time. If working out in gym clothes or swim suits for long periods of time change underwear or bottoms of swimsuit if possible. Olive Oil/Coconut Oil use for skin protection prior to activity can be used to external skin.  Rx: no treatment given until test results  Follow with Affirm  Instructions  to call with any bleeding with Provera challenge - especially if prolonged or heavy. RV prn

## 2016-04-02 NOTE — Telephone Encounter (Signed)
Spoke with patient. Patient states that she took her last dose of Provera on 03/27/2016 and is unsure what to do next. Advised patient will need to monitor for any bleeding for up to 2 weeks after taking her last dose. Patient denies any bleeding since completing her medication. Aware she will need to contact the office if she does or does not have any bleeding. States over the weekend she began having clear watery discharge that was almost "like I was urinating myself it was so much." Patient was wearing a panty liner and soaking through this. Reports she has never had that amount of discharge before. States this morning it has lessoned. Denies any odor. Asking if this can be from the medication? Advised will review with provider and return call with further recommendations. Patient is agreeable.  Routing to Ria CommentPatricia Grubb, FNP for review as Leota SauersDeborah Leonard CNM is out of the office.

## 2016-04-02 NOTE — Telephone Encounter (Signed)
Left message to call Harjas Biggins at [phone]. 

## 2016-04-02 NOTE — Patient Instructions (Signed)
Will call with test results

## 2016-04-03 ENCOUNTER — Telehealth: Payer: Self-pay | Admitting: *Deleted

## 2016-04-03 LAB — WET PREP BY MOLECULAR PROBE
CANDIDA SPECIES: NOT DETECTED
Gardnerella vaginalis: NOT DETECTED
Trichomonas vaginosis: NOT DETECTED

## 2016-04-03 NOTE — Telephone Encounter (Signed)
Patient was seen by [name], FNP on 04/02/16 @ 4pm.  Closing encounter.

## 2016-04-03 NOTE — Telephone Encounter (Signed)
-----   Message from Ria CommentPatricia Grubb, FNP sent at 04/03/2016  7:53 AM EST ----- Please let pt know that Affirm was negative and the Metrogel has cleared the BV.  She still may have a withdrawal bleed from the Provera challenge Ms. DL gave her and to call if any bleeding.

## 2016-04-03 NOTE — Telephone Encounter (Signed)
I have attempted to contact this patient by phone with the following results: left message to return call to TyroneStephanie at [phone] on answering machine (mobile per [employer]). [phone] (Mobile)

## 2016-04-03 NOTE — Telephone Encounter (Signed)
Pt notified in result note.  Closing encounter. 

## 2016-04-04 NOTE — Progress Notes (Signed)
Encounter reviewed by Dr. Brook Amundson C. Silva.  

## 2016-06-05 DIAGNOSIS — E7800 Pure hypercholesterolemia, unspecified: Secondary | ICD-10-CM | POA: Insufficient documentation

## 2016-06-21 ENCOUNTER — Ambulatory Visit: Payer: Managed Care, Other (non HMO) | Admitting: Certified Nurse Midwife

## 2016-06-26 ENCOUNTER — Encounter: Payer: Self-pay | Admitting: Certified Nurse Midwife

## 2016-06-26 ENCOUNTER — Ambulatory Visit (INDEPENDENT_AMBULATORY_CARE_PROVIDER_SITE_OTHER): Payer: Managed Care, Other (non HMO) | Admitting: Certified Nurse Midwife

## 2016-06-26 VITALS — BP 110/80 | HR 70 | Resp 16 | Ht 64.0 in | Wt 291.0 lb

## 2016-06-26 DIAGNOSIS — Z01419 Encounter for gynecological examination (general) (routine) without abnormal findings: Secondary | ICD-10-CM | POA: Diagnosis not present

## 2016-06-26 DIAGNOSIS — Z124 Encounter for screening for malignant neoplasm of cervix: Secondary | ICD-10-CM

## 2016-06-26 DIAGNOSIS — Z8742 Personal history of other diseases of the female genital tract: Secondary | ICD-10-CM

## 2016-06-26 HISTORY — DX: Personal history of other diseases of the female genital tract: Z87.42

## 2016-06-26 NOTE — Progress Notes (Signed)
55 y.o. G4P1031 Married  African American Fe here for annual exam. Menopausal no vaginal bleeding, occasional vaginal dryness. Has been using [place] with no change. Sees [facility] for Lipid labs, which was elevated. Met with MD regarding management,.back on Phentamine again for appetite control and considering sleeve surgery. Sees PCP for hypertension, anxiety and Vitamin D management. No other health issues today.  Patient's last menstrual period was 10/26/2015 (exact date).          Sexually active: Yes.    The current method of family planning is tubal ligation.    Exercising: No.  exercise Smoker:  no  Health Maintenance: Pap:  06-21-15 ascus hpv hr neg History of Abnormal Pap: ascus but no colpo MMG:  10-01-14 birads 1:neg Self Breast exams: no Colonoscopy:  2017, endoscopy 2016  BMD:   2012 TDaP:  2015 Shingles: no Pneumonia: no Hep C and HIV: hx of hep c, tested neg after treated, HIV neg per patient Labs: none   reports that she has quit smoking. She has never used smokeless tobacco. She reports that she drinks about 0.6 - 1.2 oz of alcohol per week . She reports that she does not use drugs.  Past Medical History:  Diagnosis Date  . History of abnormal Pap smear 1999   ASCUS, no colpo  . History of hepatitis C 2006  . HSV-2 infection   . Hypertension    per pt no  . STD (sexually transmitted disease)    H/O HSV 2    Past Surgical History:  Procedure Laterality Date  . ABDOMINAL SURGERY    . ANKLE FRACTURE SURGERY Left 1987   with pins  . CESAREAN SECTION  1990  . CHOLECYSTECTOMY  2001  . LAPAROSCOPIC GASTRIC BANDING  08/01/10  . TUBAL LIGATION Bilateral 1996  . UPPER GI ENDOSCOPY      Current Outpatient Prescriptions  Medication Sig Dispense Refill  . celecoxib (CELEBREX) 200 MG capsule Take 1 capsule by mouth daily.    . DULoxetine (CYMBALTA) 30 MG capsule Take 1 capsule by mouth daily.    . ibuprofen (ADVIL,MOTRIN) 200 MG tablet Take by mouth.    .  LORazepam (ATIVAN) 1 MG tablet Take 1 mg by mouth every 8 (eight) hours.    . losartan-hydrochlorothiazide (HYZAAR) 50-12.5 MG tablet Take 1 tablet by mouth daily.    . phentermine 37.5 MG capsule Take 37.5 mg by mouth every morning.    . traMADol (ULTRAM) 50 MG tablet Take by mouth as needed.    . Vitamin D, Ergocalciferol, (DRISDOL) 50000 units CAPS capsule Once weekly     No current facility-administered medications for this visit.     Family History  Problem Relation Age of Onset  . Hypertension Sister   . Breast cancer Maternal Aunt   . Multiple births Maternal Aunt     ROS:  Pertinent items are noted in HPI.  Otherwise, a comprehensive ROS was negative.  Exam:   BP 110/80   Pulse 70   Resp 16   Ht 5' 4" (1.626 m)   Wt 291 lb (132 kg)   LMP 10/26/2015 (Exact Date)   BMI 49.95 kg/m  Height: 5' 4" (162.6 cm) Ht Readings from Last 3 Encounters:  06/26/16 5' 4" (1.626 m)  04/02/16 5' 4.25" (1.632 m)  03/14/16 5' 4.25" (1.632 m)    General appearance: alert, cooperative and appears stated age Head: Normocephalic, without obvious abnormality, atraumatic Neck: no adenopathy, supple, symmetrical, trachea midline and   thyroid normal to inspection and palpation Lungs: clear to auscultation bilaterally Breasts: normal appearance, no masses or tenderness, No nipple retraction or dimpling, No nipple discharge or bleeding, No axillary or supraclavicular adenopathy Heart: regular rate and rhythm Abdomen: soft, non-tender; no masses,  no organomegaly Extremities: extremities normal, atraumatic, no cyanosis or edema Skin: Skin color, texture, turgor normal. No rashes or lesions Lymph nodes: Cervical, supraclavicular, and axillary nodes normal. No abnormal inguinal nodes palpated Neurologic: Grossly normal   Pelvic: External genitalia:  no lesions              Urethra:  normal appearing urethra with no masses, tenderness or lesions              Bartholin's and Skene's: normal                  Vagina: normal appearing vagina with normal color and discharge, no lesions              Cervix: no cervical motion tenderness, no lesions and normal appearance              Pap taken: Yes.   patient request Bimanual Exam:  Uterus:  normal size, contour, position, consistency, mobility, non-tender and limited by body habitus              Adnexa: normal adnexa and no mass, fullness, tenderness, limited by body habitus               Rectovaginal: Confirms               Anus:  normal sphincter tone, no lesions  Chaperone present: yes  A:  Well Woman with normal exam  Menopausal no HRT  Morbid obesity under bariatric clinic managment  Hypertension/anxiety with PCP management  Family history of breast cancer MA late age    P:   Reviewed health and wellness pertinent to exam  Aware of need to evaluate if vaginal bleeding  Continue follow up for weight control and PCP as indicated. Encouraged to work on weight loss, in the direction she will stay with. Questions addressed.  Pap smear: yes   counseled on breast self exam, mammography screening, menopause, adequate intake of calcium and vitamin D, diet and exercise  return annually or prn  An After Visit Summary was printed and given to the patient.  

## 2016-06-26 NOTE — Patient Instructions (Signed)

## 2016-07-03 ENCOUNTER — Encounter: Payer: Self-pay | Admitting: *Deleted

## 2016-07-03 ENCOUNTER — Telehealth: Payer: Self-pay | Admitting: *Deleted

## 2016-07-03 ENCOUNTER — Other Ambulatory Visit: Payer: Self-pay | Admitting: Certified Nurse Midwife

## 2016-07-03 DIAGNOSIS — R87612 Low grade squamous intraepithelial lesion on cytologic smear of cervix (LGSIL): Secondary | ICD-10-CM

## 2016-07-03 LAB — PAP LB, RFX HPV ASCU: PAP Smear Comment: 0

## 2016-07-03 NOTE — Telephone Encounter (Signed)
-----   Message from Verner Choleborah S Leonard, CNM sent at 07/03/2016  8:23 AM EDT ----- Notify patient that her pap smear showed LSIL and will need colposcopy evaluation. Please schedule, order placed

## 2016-07-03 NOTE — Telephone Encounter (Signed)
Left message to call Manasseh Pittsley at [phone].  

## 2016-07-03 NOTE — Telephone Encounter (Signed)
Spoke with patient, advised of results and recommendations as seen below per Leota Sauerseborah Leonard, CNM. Patient is menopausal and history of tubal ligation. Brief description of colpo given, questions answered. Patient scheduled for colpo on 07/06/16 at 2pm with Leota Sauerseborah Leonard, CNM. Advised to take Motrin 800 mg with food and water one hour before procedure. Patient verbalizes understanding and is agreeable.  Routing to provider for final review. Patient is agreeable to disposition. Will close encounter.  Cc: Harland DingwallSuzy Dixon, Braxton Feathersebecca Frahm

## 2016-07-03 NOTE — Telephone Encounter (Signed)
Returned call to patient on mobile number, no answer, no voicemail.

## 2016-07-03 NOTE — Telephone Encounter (Signed)
Patient returning call. Ok to call cell.

## 2016-07-05 ENCOUNTER — Telehealth: Payer: Self-pay | Admitting: Certified Nurse Midwife

## 2016-07-05 NOTE — Telephone Encounter (Signed)
Patient returned call. Reviewed benefit for scheduled colposcopy. Patient understood information presented. Patient is scheduled for 07/06/16 with Natalie Harding. Patient is  aware of date, arrival time and cancellation policy. No further questions. Ok to close

## 2016-07-05 NOTE — Telephone Encounter (Signed)
Call placed to patient to review benefits for a scheduled procedure. Left voicemail on the primary phone number and the secondary phone number requesting a return call.

## 2016-07-06 ENCOUNTER — Encounter: Payer: Self-pay | Admitting: Certified Nurse Midwife

## 2016-07-06 ENCOUNTER — Other Ambulatory Visit: Payer: Self-pay | Admitting: Certified Nurse Midwife

## 2016-07-06 ENCOUNTER — Ambulatory Visit (INDEPENDENT_AMBULATORY_CARE_PROVIDER_SITE_OTHER): Payer: Managed Care, Other (non HMO) | Admitting: Certified Nurse Midwife

## 2016-07-06 DIAGNOSIS — R87612 Low grade squamous intraepithelial lesion on cytologic smear of cervix (LGSIL): Secondary | ICD-10-CM | POA: Diagnosis not present

## 2016-07-06 NOTE — Patient Instructions (Signed)
COLPOSCOPY POST-PROCEDURE INSTRUCTIONS  1. You may take Ibuprofen, Aleve or Tylenol for cramping if needed.  2. If Monsel's solution was used, you will have a black discharge.  3. Light bleeding is normal.  If bleeding is heavier than your period, please call.  4. Put nothing in your vagina until the bleeding or discharge stops (usually 2 or 3 days).  5. We will call you within one week with biopsy results or discuss the results at your follow-up appointment if needed.  

## 2016-07-06 NOTE — Progress Notes (Addendum)
Patient ID: Natalie Harding, female   DOB: 02/20/1960, 56 y.o.   MRN: [id number]  Chief Complaint  Patient presents with  . Colposcopy    //jj    HPI Natalie Harding is a  Married african american g4 [phone]  56 y.o. female.  Here for colposcopy exam. Denies vaginal bleeding or pelvic pain.  HPI  Indications: Pap smear on 5/29/ 2018 showed: low-grade squamous intraepithelial neoplasia (LGSIL - encompassing HPV,mild dysplasia,CIN I). Previous colposcopy: none. Prior cervical treatment: none.  Past Medical History:  Diagnosis Date  . History of abnormal cervical Pap smear 06/26/2016   LSIL, colpo scheduled   . History of abnormal Pap smear 1999   ASCUS, no colpo  . History of hepatitis C 2006  . HSV-2 infection   . Hypertension    per pt no  . IFG (impaired fasting glucose) 10/22/2014  . STD (sexually transmitted disease)    H/O HSV 2    Past Surgical History:  Procedure Laterality Date  . ABDOMINAL SURGERY    . ANKLE FRACTURE SURGERY Left 1987   with pins  . CESAREAN SECTION  1990  . CHOLECYSTECTOMY  2001  . LAPAROSCOPIC GASTRIC BANDING  08/01/10  . TUBAL LIGATION Bilateral 1996  . UPPER GI ENDOSCOPY      Family History  Problem Relation Age of Onset  . Hypertension Sister   . Breast cancer Maternal Aunt   . Multiple births Maternal Aunt     Social History Social History  Substance Use Topics  . Smoking status: Former Games developer  . Smokeless tobacco: Never Used  . Alcohol use 0.6 - 1.2 oz/week    1 - 2 Standard drinks or equivalent per week    Allergies  Allergen Reactions  . Ciprofloxacin Swelling    Current Outpatient Prescriptions  Medication Sig Dispense Refill  . celecoxib (CELEBREX) 200 MG capsule Take 1 capsule by mouth daily.    . DULoxetine (CYMBALTA) 30 MG capsule Take 1 capsule by mouth daily.    Marland Kitchen ibuprofen (ADVIL,MOTRIN) 200 MG tablet Take by mouth.    Marland Kitchen LORazepam (ATIVAN) 1 MG tablet Take 1 mg by mouth every 8 (eight) hours.    Marland Kitchen  losartan-hydrochlorothiazide (HYZAAR) 50-12.5 MG tablet Take 1 tablet by mouth daily.    . phentermine 37.5 MG capsule Take 37.5 mg by mouth every morning.    . traMADol (ULTRAM) 50 MG tablet Take by mouth as needed.    . Vitamin D, Ergocalciferol, (DRISDOL) 50000 units CAPS capsule Once weekly     No current facility-administered medications for this visit.     Review of Systems Review of Systems  Constitutional: Negative.   Gastrointestinal: Negative.   Genitourinary: Negative for vaginal bleeding, vaginal discharge and vaginal pain.  Skin: Negative.     Blood pressure 110/80, pulse 72, resp. rate 16, height 5\' 4"  (1.626 m), weight 281 lb (127.5 kg).  Physical Exam Physical Exam  Constitutional: She is oriented to person, place, and time. She appears well-developed and well-nourished.  Genitourinary: Vagina normal. There is no rash, tenderness or lesion on the right labia. There is no rash, tenderness or lesion on the left labia.    Neurological: She is alert and oriented to person, place, and time.  Skin: Skin is warm and dry.  Psychiatric: She has a normal mood and affect. Her behavior is normal. Judgment and thought content normal.    Data Reviewed Reviewed pap smear results with patient . Questions addressed.  Assessment    Procedure  Details  The risks and benefits of the procedure and Written informed consent obtained.  Speculum placed in vagina and excellent visualization of cervix achieved with small SCJ,  cervix swabbed x 3 with saline solution and  With acetic acid solution. Acetowhite effect noted on cervix with geographic appearance. Lugol's applied with non staining also noted in same area. Cervix viewed with 3.5, 7.5 and 15 # with green filter also. Biopsy taken at 6, 12, 9 o'clock. ECC obtained. Monsel's applied to area with no active bleeding noted upon removal of speculum. Patient tolerated procedure well. Instructions given.  Specimens: 4  Complications:  none.     Plan    Specimens labelled and sent to Pathology with [facility] Patient will be called with results once reviewed.  Pathology reviewed and all biopsies were negative for malignancy or dysplasia.  Chronic Cervicitis noted. Squamous metaplasia type lesion only. Ecc shows benign endocervical glands. Patient will be called with results and placed in pap recall for one year. 08   Natalie Harding 07/06/2016, 2:21 PM

## 2016-07-06 NOTE — Progress Notes (Signed)
06-26-16 pap LGSIL.

## 2016-07-10 LAB — PATHOLOGY

## 2016-07-16 ENCOUNTER — Other Ambulatory Visit: Payer: Self-pay | Admitting: *Deleted

## 2016-07-16 ENCOUNTER — Telehealth: Payer: Self-pay | Admitting: *Deleted

## 2016-07-16 NOTE — Telephone Encounter (Signed)
-----   Message from Natalie Harding, CNM sent at 07/15/2016  6:58 AM EDT ----- Notify patient that cervical biopsy showed just chronic cervicitis which is seen with Squamous Metaplasia, no dysplasia or malignancy noted ECC negative No treatment needed, just repeat pap in one year. Pap recall 08

## 2016-07-16 NOTE — Telephone Encounter (Signed)
Left message to call Naz Denunzio at [phone].  08 recall placed. 

## 2016-07-17 NOTE — Telephone Encounter (Signed)
Patient returning your call.

## 2016-07-17 NOTE — Telephone Encounter (Signed)
Left message to call Sherif Millspaugh at [phone].  

## 2016-07-18 ENCOUNTER — Encounter: Payer: Self-pay | Admitting: *Deleted

## 2016-07-18 NOTE — Telephone Encounter (Signed)
Spoke with patient, advised of results and recommendations as seen below per Deborah Leonard, CNM. Patient verbalizes understanding and is agreeable.  Routing to provider for final review. Patient is agreeable to disposition. Will close encounter.  

## 2017-02-25 ENCOUNTER — Encounter (HOSPITAL_COMMUNITY): Payer: Self-pay

## 2017-03-26 ENCOUNTER — Ambulatory Visit: Payer: Managed Care, Other (non HMO) | Admitting: Certified Nurse Midwife

## 2017-03-26 NOTE — Progress Notes (Signed)
57 y.o. Married [place] female [phone] here with complaint of vaginal symptoms of itching, burning, and increase discharge for approximately 2 weeks.Marland Kitchen. Describes discharge as white with odor. Patient was on antibiotics for about 14 days due to abscess tooth. Noticed symptoms starting after that..  Denies new personal products or vaginal dryness. No STD concerns. Urinary symptoms none . Had tooth extracted and feeling better. Contraception is menopausal. No other health issues.  Review of Systems  Constitutional: Negative for chills, fever and malaise/fatigue.  Gastrointestinal: Negative for abdominal pain and nausea.  Genitourinary: Negative for dysuria, frequency and urgency.    O:Healthy female WDWN Affect: normal, orientation x 3  Exam:  Abdomen: non tender, no masses  Inguinal Lymph nodes: no enlargement or tenderness Pelvic exam: External genital: normal female BUS: negative Vagina: beige odorous discharge noted. Affirm taken Cervix: normal, non tender, no CMT Uterus: normal, non tender Adnexa:normal, non tender, no masses or fullness noted   A:Normal pelvic exam Menopausal no HRT Recent antibiotics for tooth abscess, suspect antibiotic caused   P:Discussed findings of normal pelvic exam. Discussed Aveeno or baking soda sitz bath for comfort. Avoid moist clothes or pads for extended period of time. Discussed probiotic use with antibiotics to help maintain normal vaginal health. Questions addressed Lab Affirm Will treat if indicated  Rv prn

## 2017-03-26 NOTE — Progress Notes (Deleted)
57 y.o. Married {Race/ethnicity:[id number]} female [phone] here with complaint of vaginal symptoms of itching, burning, and increase discharge. Describes discharge as ***. Onset of symptoms *** days ago. Denies new personal products or vaginal dryness. *** STD concerns. Urinary symptoms *** . Contraception is  ROS  O:Healthy female WDWN Affect: normal, orientation x 3  Exam: Abdomen: Lymph node: no enlargement or tenderness Pelvic exam: External genital: normal female BUS: negative Vagina: *** discharge noted. Ph:   ,Wet prep taken, Affirm taken Cervix: normal, non tender, no CMT Uterus: normal, non tender Adnexa:normal, non tender, no masses or fullness noted   Wet Prep results:   A:Normal pelvic exam   P:Discussed findings of *** and etiology. Discussed Aveeno or baking soda sitz bath for comfort. Avoid moist clothes or pads for extended period of time. If working out in gym clothes or swim suits for long periods of time change underwear or bottoms of swimsuit if possible. Coconut Oil use for skin protection prior to activity can be used to external skin for protection or dryness. Rx: ***  Rv prn

## 2017-03-27 ENCOUNTER — Other Ambulatory Visit: Payer: Self-pay

## 2017-03-27 ENCOUNTER — Encounter: Payer: Self-pay | Admitting: Certified Nurse Midwife

## 2017-03-27 ENCOUNTER — Ambulatory Visit: Payer: Managed Care, Other (non HMO) | Admitting: Certified Nurse Midwife

## 2017-03-27 VITALS — BP 124/80 | HR 70 | Resp 16 | Ht 64.0 in | Wt 298.0 lb

## 2017-03-27 DIAGNOSIS — N898 Other specified noninflammatory disorders of vagina: Secondary | ICD-10-CM | POA: Diagnosis not present

## 2017-03-27 NOTE — Patient Instructions (Signed)

## 2017-03-28 ENCOUNTER — Telehealth: Payer: Self-pay | Admitting: Certified Nurse Midwife

## 2017-03-28 LAB — VAGINITIS/VAGINOSIS, DNA PROBE
Candida Species: NEGATIVE
Gardnerella vaginalis: NEGATIVE
Trichomonas vaginosis: POSITIVE — AB

## 2017-03-28 NOTE — Telephone Encounter (Signed)
Routing to PepsiCoDeborah Leonard CNM for review of results from 03/27/2017.

## 2017-03-28 NOTE — Telephone Encounter (Signed)
Patient is asking for her return from the nurse to be her cell [phone].

## 2017-03-28 NOTE — Telephone Encounter (Signed)
Left message to call Miki Blank at [phone].  Notes recorded by Verner CholLeonard, Deborah S, CNM on 03/28/2017 at 3:31 PM EST Notify patient that her vaginal screen was positive for trichomonas which is a vaginal STD. Needs Rx Flagyl bid x 7 days and will need TOC in 2-3 weeks

## 2017-03-28 NOTE — Telephone Encounter (Signed)
Patient is calling for recent results. Patient states that you may leave results on her voicemail.

## 2017-03-29 ENCOUNTER — Telehealth: Payer: Self-pay | Admitting: Certified Nurse Midwife

## 2017-03-29 MED ORDER — METRONIDAZOLE 500 MG PO TABS: 500.0000 mg | ORAL_TABLET | Freq: Two times a day (BID) | ORAL | 0 refills | Status: DC

## 2017-03-29 NOTE — Telephone Encounter (Signed)
Spoke with patient, advised as seen below per Leota Sauerseborah Leonard, CNM. Advised to use condoms until you and partner are treated and for 7 days after.   Provided option of EPT, patient requested RX. Name, DOB and allergies provided. Advised can pick up written RX for Flagyl 2 grams po once, from the office will be placed up front. Patient states this will be picked up by spouse.   Patient asking if alternative medication for flagyl or alternative dose can be prescribed, advised will review with Leota Sauerseborah Leonard, CNM and return call.

## 2017-03-29 NOTE — Telephone Encounter (Signed)
Left message to call Alaijah Gibler at [phone].  

## 2017-03-29 NOTE — Telephone Encounter (Signed)
Spoke with patient. Patient requesting RX for external itching and burning. Will start flagyl for treatment of trichomonas. Advised patient testing negative for yeast, recommended coconut oil externally and Aveeno baths for relief. Return call to office if symptoms do not resolve. Patient verbalizes understanding and is agreeable.   Routing to provider for final review. Patient is agreeable to disposition. Will close encounter.

## 2017-03-29 NOTE — Telephone Encounter (Signed)
Patient talked to nurse earlier and would like something called in for the itch as well. [facility] in [place] at [phone].

## 2017-03-29 NOTE — Telephone Encounter (Signed)
Left message to call Merideth Bosque at [phone].  

## 2017-03-29 NOTE — Telephone Encounter (Signed)
Reviewed with Leota Sauerseborah Leonard, CNM -may send Rx for flagyl 2 grams po once or Flagyl 500 mg bid x7 days.   Rx for EPT signed, placed at front office for pick up.   Call returned to patient, Left message to call Noreene LarssonJill at [phone].

## 2017-03-29 NOTE — Telephone Encounter (Signed)
Patient left message on answering machine returning call. °

## 2017-03-29 NOTE — Addendum Note (Signed)
Addended by: Leda MinHAMM, Gatha Mcnulty N on: 03/29/2017 12:23 PM   Modules accepted: Orders

## 2017-03-29 NOTE — Telephone Encounter (Signed)
Spoke with patient, advised as seen below per Leota Sauerseborah Leonard, CNM. Patient request Flagyl po bid x7 days. ETOH precautions reviewed, RX to verified pharmacy. TOC scheduled for 3/15 at 4pm with Leota Sauerseborah Leonard, CNM.  Routing to provider for final review. Patient is agreeable to disposition. Will close encounter.

## 2017-04-12 ENCOUNTER — Ambulatory Visit: Payer: Managed Care, Other (non HMO) | Admitting: Certified Nurse Midwife

## 2017-04-12 ENCOUNTER — Encounter: Payer: Self-pay | Admitting: Certified Nurse Midwife

## 2017-04-12 ENCOUNTER — Other Ambulatory Visit: Payer: Self-pay

## 2017-04-12 VITALS — BP 120/80 | HR 70 | Resp 16 | Ht 64.0 in | Wt 300.0 lb

## 2017-04-12 DIAGNOSIS — Z113 Encounter for screening for infections with a predominantly sexual mode of transmission: Secondary | ICD-10-CM | POA: Diagnosis not present

## 2017-04-12 NOTE — Progress Notes (Signed)
57 y.o. Married [place] female [phone] here for follow up for treatment for Trichomonas. Patient completed all the Flagyl with any problems. Spouse was screened and was negative. He also took medication too. Denies vaginal symptoms of itching, burning, and increase discharge. No discharge noted.No STD concerns, but would to do screening. No other health issues today.  ROS Pertinent to above  O:Healthy female WDWN Affect: normal, orientation x 3  Exam:Skin warm and dry Abdomen:soft, non tender   Inguinal Lymph nodes: no enlargement or tenderness Pelvic exam: External genital: normal female BUS: negative Vagina: white slightly odorous  discharge noted. Specimens collected Cervix: normal, non tender, no CMT Uterus: normal, non tender Adnexa:normal, non tender, no masses or fullness noted    A:Normal pelvic exam TOC for trichomonas today  STD screening   P:Discussed findings of normal pelvic exam . Discussed STD transmission and would recommend screening. Patient did not feel trust issues are concerns, but encouraged to screen. Discussed prevention of STD also. Questions addressed. Labs: STD panel, Hep C, GC/chlamydia, Affirm  Rv prn

## 2017-04-13 LAB — HEP, RPR, HIV PANEL
HIV SCREEN 4TH GENERATION: NONREACTIVE
Hepatitis B Surface Ag: NEGATIVE
RPR Ser Ql: NONREACTIVE

## 2017-04-13 LAB — HEPATITIS C ANTIBODY

## 2017-04-13 LAB — VAGINITIS/VAGINOSIS, DNA PROBE
Candida Species: NEGATIVE
Gardnerella vaginalis: POSITIVE — AB
TRICHOMONAS VAG: NEGATIVE

## 2017-04-13 LAB — GC/CHLAMYDIA PROBE AMP
Chlamydia trachomatis, NAA: NEGATIVE
Neisseria gonorrhoeae by PCR: NEGATIVE

## 2017-04-15 ENCOUNTER — Other Ambulatory Visit: Payer: Self-pay | Admitting: Certified Nurse Midwife

## 2017-04-16 ENCOUNTER — Telehealth: Payer: Self-pay | Admitting: Certified Nurse Midwife

## 2017-04-16 MED ORDER — CLINDAMYCIN PHOSPHATE 2 % VA CREA: 1.0000 | TOPICAL_CREAM | Freq: Every day | VAGINAL | 0 refills | Status: DC

## 2017-04-16 NOTE — Telephone Encounter (Signed)
Feel patient should follow up with provider who treated Hep C.

## 2017-04-16 NOTE — Telephone Encounter (Addendum)
Spoke with patient. Results given as seen below. Patient verbalizes understanding. Rx for Cleocin cream one applicator every hs x 7 nights sent to pharmacy on file. Patient states that she has not had recent follow up for Hep C. Advised will review with Leota Sauerseborah Leonard CNM to discuss next steps.   Notes recorded by Verner CholLeonard, Deborah S, CNM on 04/16/2017 at 7:48 AM EDT See previous note ------  Notes recorded by Verner CholLeonard, Deborah S, CNM on 04/15/2017 at 9:50 AM EDT Notify Chlamydia and Gonorrhea are negative Vaginal screen is negative for Trichomonas, but positive for BV will need Rx Cleocin cream one applicator every hs x 7, previous use of Flagyl for Trichomonas Hep B, RPR, HIV are negative Her Hep. C antibody is positive, but she was treated per my note in 5/17 and that she was negative,. Check with Patient if any recent follow up,if not will need the HCV above verification.

## 2017-04-16 NOTE — Telephone Encounter (Signed)
Patient called to see if her recent STD results are ready yet. Last seen: 04/12/17

## 2017-04-17 NOTE — Telephone Encounter (Signed)
Spoke with patient. [facility]. Is now seeing Dr.Jackson. States Dr.Jackson recently checked her Hep C testing and she was advised no follow up needed. Requests all labs be sent to Dr.Jackson for her review and recommendations. Labs sent to Dr.Jackson. Patient will return call with further information.

## 2017-04-17 NOTE — Telephone Encounter (Signed)
Patient returned call

## 2017-04-17 NOTE — Telephone Encounter (Signed)
[facility]

## 2017-04-17 NOTE — Telephone Encounter (Signed)
Left message to call Jess Toney at [phone]. 

## 2017-05-08 NOTE — Telephone Encounter (Signed)
Spoke with patient who states that she reviewed results with PCP and has been referred to a specialist for Hep C. Is scheduled on 05/10/2017.  Routing to provider for final review. Patient agreeable to disposition. Will close encounter.

## 2017-05-13 DIAGNOSIS — F419 Anxiety disorder, unspecified: Secondary | ICD-10-CM | POA: Insufficient documentation

## 2017-06-28 ENCOUNTER — Other Ambulatory Visit: Payer: Self-pay

## 2017-06-28 ENCOUNTER — Other Ambulatory Visit (HOSPITAL_COMMUNITY)
Admission: RE | Admit: 2017-06-28 | Discharge: 2017-06-28 | Disposition: A | Discharge status: Home / Self Care | Payer: Managed Care, Other (non HMO) | Source: Ambulatory Visit | Attending: Certified Nurse Midwife | Admitting: Certified Nurse Midwife

## 2017-06-28 ENCOUNTER — Encounter: Payer: Self-pay | Admitting: Certified Nurse Midwife

## 2017-06-28 ENCOUNTER — Ambulatory Visit (INDEPENDENT_AMBULATORY_CARE_PROVIDER_SITE_OTHER): Payer: Managed Care, Other (non HMO) | Admitting: Certified Nurse Midwife

## 2017-06-28 VITALS — BP 120/78 | HR 70 | Resp 16 | Ht 63.5 in | Wt 292.0 lb

## 2017-06-28 DIAGNOSIS — Z8742 Personal history of other diseases of the female genital tract: Secondary | ICD-10-CM

## 2017-06-28 DIAGNOSIS — Z87898 Personal history of other specified conditions: Secondary | ICD-10-CM | POA: Diagnosis not present

## 2017-06-28 DIAGNOSIS — Z01419 Encounter for gynecological examination (general) (routine) without abnormal findings: Secondary | ICD-10-CM | POA: Insufficient documentation

## 2017-06-28 DIAGNOSIS — Z6841 Body Mass Index (BMI) 40.0 and over, adult: Secondary | ICD-10-CM | POA: Insufficient documentation

## 2017-06-28 DIAGNOSIS — Z124 Encounter for screening for malignant neoplasm of cervix: Secondary | ICD-10-CM | POA: Diagnosis not present

## 2017-06-28 DIAGNOSIS — N951 Menopausal and female climacteric states: Secondary | ICD-10-CM | POA: Insufficient documentation

## 2017-06-28 NOTE — Progress Notes (Signed)
57 y.o. [id number] Married  African American Fe here for annual exam. Had gained weight over the past year, now back to last year weight at 292. Sees PCP for hypertension/anxiety and vitamin D and cholesterol and medication management.  Previous treatment for weight management with Bariatric surgery. Considering the  Gastric sleeve surgery.  Menopausal hot flashes/night sweats occasional no issues. Denies vaginal bleeding. Occasional  vaginal dryness. No STD concerns or testing needed. Previous trichomonas treated. Last Hep C after treatment negative. Seeing specialist for joint pain and edema in lower legs and feet. Pain is better. Recent treatment for Flu after traveling to [place], recovered with problems. No other health issues today.  Patient's last menstrual period was 10/26/2015 (exact date).          Sexually active: Yes.    The current method of family planning is tubal ligation.    Exercising: No.  exercise Smoker:  no  Health Maintenance: Pap:  06-21-15 ascus HPV HR neg, 06-26-16 LGSIL History of Abnormal Pap: yes MMG:  11-16-16 neg Self Breast exams: no Colonoscopy:  2017 negative 10 years BMD:   2012 TDaP:  2015 Shingles: no Pneumonia: no Hep C and HIV: hep c neg per patient Labs: with PCP   reports that she has quit smoking. She has never used smokeless tobacco. She reports that she drinks about 1.2 - 1.8 oz of alcohol per week. She reports that she does not use drugs.  Past Medical History:  Diagnosis Date  . History of abnormal cervical Pap smear 06/26/2016   LSIL, colpo scheduled; colpo 06/2016 squamous metapasia/chronic cervicitis  . History of abnormal Pap smear 1999   ASCUS, no colpo  . History of hepatitis C 2006  . HSV-2 infection   . Hypertension    per pt no  . IFG (impaired fasting glucose) 10/22/2014  . STD (sexually transmitted disease)    H/O HSV 2, trichomonas 2/19    Past Surgical History:  Procedure Laterality Date  . ABDOMINAL SURGERY    . ANKLE  FRACTURE SURGERY Left 1987   with pins  . CESAREAN SECTION  1990  . CHOLECYSTECTOMY  2001  . LAPAROSCOPIC GASTRIC BANDING  08/01/10  . TUBAL LIGATION Bilateral 1996  . UPPER GI ENDOSCOPY      Current Outpatient Medications  Medication Sig Dispense Refill  . celecoxib (CELEBREX) 200 MG capsule Take 1 capsule by mouth daily.    Marland Kitchen ibuprofen (ADVIL,MOTRIN) 200 MG tablet Take by mouth.    Marland Kitchen LORazepam (ATIVAN) 1 MG tablet Take 1 mg by mouth every 8 (eight) hours.    Marland Kitchen losartan-hydrochlorothiazide (HYZAAR) 100-25 MG tablet Take by mouth.    . traMADol (ULTRAM) 50 MG tablet Take by mouth as needed.     No current facility-administered medications for this visit.     Family History  Problem Relation Age of Onset  . Hypertension Sister   . Breast cancer Maternal Aunt   . Multiple births Maternal Aunt     ROS:  Pertinent items are noted in HPI.  Otherwise, a comprehensive ROS was negative.  Exam:   BP 120/78   Pulse 70   Resp 16   Ht 5' 3.5" (1.613 m)   Wt 292 lb (132.5 kg)   LMP 10/26/2015 (Exact Date)   BMI 50.91 kg/m  Height: 5' 3.5" (161.3 cm) Ht Readings from Last 3 Encounters:  06/28/17 5' 3.5" (1.613 m)  04/12/17  (1.626 m)  03/27/17  (1.626 m)  General appearance: alert, cooperative and appears stated age Head: Normocephalic, without obvious abnormality, atraumatic Neck: no adenopathy, supple, symmetrical, trachea midline and thyroid normal to inspection and palpation Lungs: clear to auscultation bilaterally Breasts: normal appearance, no masses or tenderness, No nipple retraction or dimpling, No nipple discharge or bleeding, No axillary or supraclavicular adenopathy Heart: regular rate and rhythm Abdomen: soft, non-tender; no masses,  no organomegaly Extremities: extremities normal, atraumatic, no cyanosis or edema Skin: Skin color, texture, turgor normal. No rashes or lesions Lymph nodes: Cervical, supraclavicular, and axillary nodes normal. No abnormal  inguinal nodes palpated Neurologic: Grossly normal   Pelvic: External genitalia:  no lesions              Urethra:  normal appearing urethra with no masses, tenderness or lesions              Bartholin's and Skene's: normal                 Vagina: normal appearing vagina with normal color and discharge, no lesions              Cervix: multiparous appearance, no cervical motion tenderness and no lesions              Pap taken: Yes.   Bimanual Exam:  Uterus:  normal size, contour, position, consistency, mobility, non-tender and anteverted              Adnexa: normal adnexa, no mass, fullness, tenderness and limited by body habitus, no large masses noted               Rectovaginal: Confirms               Anus:  normal sphincter tone, no lesions  Chaperone present: yes  A:  Well Woman with normal exam  Menopausal no HRT  Follow up pap smear of LSIL with chronic cervicitis noted on colpo  Hypertension/anxiety/cholesterol with PCP management  Obesity, previous lap band surgery , considering gastric sleeve for weight management  P:   Reviewed health and wellness pertinent to exam  Discussed if vaginal bleeding noted to advise.  Continue follow up with PCP as indicated.  Encouraged to continue weight loss journey with diet and exercise, which will help if she decides on surgery again.  Pap smear: yes, if normal repeat one year, otherwise per results  counseled on breast self exam, mammography screening, feminine hygiene, adequate intake of calcium and vitamin D, diet and exercise  return annually or prn  An After Visit Summary was printed and given to the patient.

## 2017-07-02 LAB — CYTOLOGY - PAP
Adequacy: ABSENT
Diagnosis: NEGATIVE
HPV (WINDOPATH): NOT DETECTED

## 2018-03-14 DIAGNOSIS — L989 Disorder of the skin and subcutaneous tissue, unspecified: Secondary | ICD-10-CM | POA: Insufficient documentation

## 2018-07-08 ENCOUNTER — Ambulatory Visit: Payer: Managed Care, Other (non HMO) | Admitting: Certified Nurse Midwife

## 2018-07-23 NOTE — Progress Notes (Signed)
58 y.o. [id number] Married  African American Fe here for annual exam. Menopausal no HRT. Denies vaginal bleeding or vaginal dryness. Has been working on weight loss with 14 pounds. Seeing Misha Letha Cape every 3 months to follow up weight and Hypertension and orthopedic for left knee issues.  No HSV 2 outbreaks in several years. Hep. C was negative at PCP. Has been having slight problem with holding urine with BP medication, but waits to long to go. No UTI symptoms. Desires STD screening GC/Chlamydia only. No other health issues today.  Patient's last menstrual period was 10/26/2015 (exact date).          Sexually active: yes The current method of family planning is tubal ligation.    Exercising: No.  exercise Smoker:  no  Review of Systems  Constitutional: Negative.   HENT: Negative.   Eyes: Negative.   Respiratory: Negative.   Cardiovascular: Negative.   Gastrointestinal: Negative.   Genitourinary: Positive for urgency.       Urgency only when holding urine  Musculoskeletal: Negative.   Skin: Negative.   Neurological: Negative.   Endo/Heme/Allergies: Negative.   Psychiatric/Behavioral: Negative.     Health Maintenance: Pap:  06-26-16 LGSIL, 06-28-17 neg HPV HR neg History of Abnormal Pap: yes MMG:  11-30-17 birads 1:neg Self Breast exams: occ Colonoscopy:  2017 f/u 36yrs BMD:   2012 TDaP:  2015 Shingles: no Pneumonia: no Hep C and HIV: HIV neg 2019, hep c positive 2019 Labs: if needed   reports that she has quit smoking. She has never used smokeless tobacco. She reports current alcohol use of about 2.0 - 3.0 standard drinks of alcohol per week. She reports that she does not use drugs.  Past Medical History:  Diagnosis Date  . History of abnormal cervical Pap smear 06/26/2016   LSIL, colpo scheduled; colpo 06/2016 squamous metapasia/chronic cervicitis  . History of abnormal Pap smear 1999   ASCUS, no colpo  . History of hepatitis C 2006  . HSV-2 infection   .  Hypertension    per pt no  . IFG (impaired fasting glucose) 10/22/2014  . STD (sexually transmitted disease)    H/O HSV 2, trichomonas 2/19    Past Surgical History:  Procedure Laterality Date  . ABDOMINAL SURGERY    . ANKLE FRACTURE SURGERY Left 1987   with pins  . CESAREAN SECTION  1990  . CHOLECYSTECTOMY  2001  . LAPAROSCOPIC GASTRIC BANDING  08/01/10  . TUBAL LIGATION Bilateral 1996  . UPPER GI ENDOSCOPY      Current Outpatient Medications  Medication Sig Dispense Refill  . celecoxib (CELEBREX) 200 MG capsule Take 1 capsule by mouth daily.    Marland Kitchen ibuprofen (ADVIL,MOTRIN) 200 MG tablet Take by mouth.    Marland Kitchen LORazepam (ATIVAN) 1 MG tablet Take 1 mg by mouth every 8 (eight) hours.    Marland Kitchen losartan-hydrochlorothiazide (HYZAAR) 100-25 MG tablet Take by mouth.    . traMADol (ULTRAM) 50 MG tablet Take by mouth as needed.     No current facility-administered medications for this visit.     Family History  Problem Relation Age of Onset  . Hypertension Sister   . Breast cancer Maternal Aunt   . Multiple births Maternal Aunt     ROS:  Pertinent items are noted in HPI.  Otherwise, a comprehensive ROS was negative.  Exam:   BP 124/84   Pulse 70   Temp 97.6 F (36.4 C) (Skin)   Resp 16   Ht 5' 3.5" (  1.613 m)   Wt 276 lb (125.2 kg)   LMP 10/26/2015 (Exact Date)   BMI 48.12 kg/m  Height: 5' 3.5" (161.3 cm) Ht Readings from Last 3 Encounters:  07/25/18 5' 3.5" (1.613 m)  06/28/17 5' 3.5" (1.613 m)  04/12/17 5\' 4"  (1.626 m)    General appearance: alert, cooperative and appears stated age Head: Normocephalic, without obvious abnormality, atraumatic Neck: no adenopathy, supple, symmetrical, trachea midline and thyroid normal to inspection and palpation Lungs: clear to auscultation bilaterally Breasts: normal appearance, no masses or tenderness, No nipple retraction or dimpling, No nipple discharge or bleeding, No axillary or supraclavicular adenopathy, pendulous Heart: regular rate  and rhythm Abdomen: soft, non-tender; no masses,  no organomegaly Extremities: extremities normal, atraumatic, no cyanosis, pedal edema bilateral 2 + ( normal for her) Skin: Skin color, texture, turgor normal. No rashes or lesions Lymph nodes: Cervical, supraclavicular, and axillary nodes normal. No abnormal inguinal nodes palpated Neurologic: Grossly normal   Pelvic: External genitalia:  no lesions, normal female              Urethra:  normal appearing urethra with no masses, tenderness or lesions              Bartholin's and Skene's: normal                 Vagina: normal appearing vagina with normal color and discharge, no lesions              Cervix: parous, non tender              Pap taken: Yes.   Bimanual Exam:  Uterus:  normal size, contour, position, consistency, mobility, non-tender and anteverted              Adnexa: normal adnexa and no mass, fullness, tenderness               Rectovaginal: Confirms               Anus:  normal sphincter tone, no lesions  Chaperone present: yes  A:  Well Woman with normal exam  Menopausal no HRT  Obesity on weight loss now(previous gastric banding)  Hypertension, cholesterol with PCP management  History of Hep C treated last screen was negative  STD screening previous trichomonas  P:   Reviewed health and wellness pertinent to exam  Aware of need to advise if vaginal bleeding  Encouraged to continue her weight loss journey for better health  Continue follow up with PCP as indicated  Pap smear: yes   counseled on breast self exam, mammography screening, STD prevention, HIV risk factors and prevention, menopause, adequate intake of calcium and vitamin D, diet and exercise  return annually or prn  An After Visit Summary was printed and given to the patient.

## 2018-07-25 ENCOUNTER — Encounter: Payer: Self-pay | Admitting: Certified Nurse Midwife

## 2018-07-25 ENCOUNTER — Ambulatory Visit (INDEPENDENT_AMBULATORY_CARE_PROVIDER_SITE_OTHER): Payer: Managed Care, Other (non HMO) | Admitting: Certified Nurse Midwife

## 2018-07-25 ENCOUNTER — Other Ambulatory Visit: Payer: Self-pay

## 2018-07-25 ENCOUNTER — Other Ambulatory Visit (HOSPITAL_COMMUNITY)
Admission: RE | Admit: 2018-07-25 | Discharge: 2018-07-25 | Disposition: A | Discharge status: Home / Self Care | Payer: Managed Care, Other (non HMO) | Source: Ambulatory Visit | Attending: Certified Nurse Midwife | Admitting: Certified Nurse Midwife

## 2018-07-25 VITALS — BP 124/84 | HR 70 | Temp 97.6°F | Resp 16 | Ht 63.5 in | Wt 276.0 lb

## 2018-07-25 DIAGNOSIS — Z113 Encounter for screening for infections with a predominantly sexual mode of transmission: Secondary | ICD-10-CM | POA: Insufficient documentation

## 2018-07-25 DIAGNOSIS — Z124 Encounter for screening for malignant neoplasm of cervix: Secondary | ICD-10-CM | POA: Insufficient documentation

## 2018-07-25 DIAGNOSIS — Z01419 Encounter for gynecological examination (general) (routine) without abnormal findings: Secondary | ICD-10-CM

## 2018-07-29 LAB — CYTOLOGY - PAP
Chlamydia: NEGATIVE
Diagnosis: NEGATIVE
HPV: NOT DETECTED
Neisseria Gonorrhea: NEGATIVE
Trichomonas: NEGATIVE

## 2018-12-03 ENCOUNTER — Encounter: Payer: Self-pay | Admitting: Certified Nurse Midwife

## 2019-04-20 ENCOUNTER — Encounter: Payer: Self-pay | Admitting: Certified Nurse Midwife

## 2019-06-30 HISTORY — PX: LAPAROSCOPIC GASTRIC BAND REMOVAL WITH LAPAROSCOPIC GASTRIC SLEEVE RESECTION: SHX6498

## 2019-07-31 ENCOUNTER — Ambulatory Visit: Payer: Managed Care, Other (non HMO) | Admitting: Certified Nurse Midwife

## 2020-03-08 ENCOUNTER — Ambulatory Visit: Payer: Managed Care, Other (non HMO) | Admitting: Obstetrics and Gynecology

## 2020-03-09 NOTE — Progress Notes (Deleted)
60 y.o. [id number] Married [place] American female here for annual exam.    PCP:     Patient's last menstrual period was 10/26/2015 (exact date).           Sexually active: {yes no:314532}  The current method of family planning is tubal ligation.    Exercising: {yes no:314532}  {types:19826} Smoker:  no  Health Maintenance: Pap: 07-25-18 Neg:Neg HR HPV, 06-28-17 Neg:Neg HR HPV, 06-26-16 LGSIL History of abnormal Pap:  Yes, 07-06-16 colpo cx bxs show chronic cervicitis and Neg ECC;06-26-16 LSIL MMG: ***12-03-18 Neg/Birads1 Colonoscopy: 2017 f/u 10 years BMD:***2012 Result  *** TDaP:2015 Gardasil:   no HIV: 04-12-17 NR Hep C: 04-12-17 Pos--Tx'd 2017 Screening Labs:  Hb today: ***, Urine today: ***   reports that she has quit smoking. She has never used smokeless tobacco. She reports current alcohol use of about 2.0 - 3.0 standard drinks of alcohol per week. She reports that she does not use drugs.  Past Medical History:  Diagnosis Date  . History of abnormal cervical Pap smear 06/26/2016   LSIL, colpo scheduled; colpo 06/2016 squamous metapasia/chronic cervicitis  . History of abnormal Pap smear 1999   ASCUS, no colpo  . History of hepatitis C 2006  . HSV-2 infection   . Hypertension    per pt no  . IFG (impaired fasting glucose) 10/22/2014  . STD (sexually transmitted disease)    H/O HSV 2, trichomonas 2/19    Past Surgical History:  Procedure Laterality Date  . ABDOMINAL SURGERY    . ANKLE FRACTURE SURGERY Left 1987   with pins  . CESAREAN SECTION  1990  . CHOLECYSTECTOMY  2001  . LAPAROSCOPIC GASTRIC BANDING  08/01/10  . TUBAL LIGATION Bilateral 1996  . UPPER GI ENDOSCOPY      Current Outpatient Medications  Medication Sig Dispense Refill  . celecoxib (CELEBREX) 200 MG capsule Take 1 capsule by mouth daily.    Marland Kitchen ibuprofen (ADVIL,MOTRIN) 200 MG tablet Take by mouth.    Marland Kitchen LORazepam (ATIVAN) 1 MG tablet Take 1 mg by mouth every 8 (eight) hours.    Marland Kitchen  losartan-hydrochlorothiazide (HYZAAR) 100-25 MG tablet Take by mouth.    . traMADol (ULTRAM) 50 MG tablet Take by mouth as needed.     No current facility-administered medications for this visit.    Family History  Problem Relation Age of Onset  . Hypertension Sister   . Breast cancer Maternal Aunt   . Multiple births Maternal Aunt     Review of Systems  Exam:   LMP 10/26/2015 (Exact Date)     General appearance: alert, cooperative and appears stated age Head: normocephalic, without obvious abnormality, atraumatic Neck: no adenopathy, supple, symmetrical, trachea midline and thyroid normal to inspection and palpation Lungs: clear to auscultation bilaterally Breasts: normal appearance, no masses or tenderness, No nipple retraction or dimpling, No nipple discharge or bleeding, No axillary adenopathy Heart: regular rate and rhythm Abdomen: soft, non-tender; no masses, no organomegaly Extremities: extremities normal, atraumatic, no cyanosis or edema Skin: skin color, texture, turgor normal. No rashes or lesions Lymph nodes: cervical, supraclavicular, and axillary nodes normal. Neurologic: grossly normal  Pelvic: External genitalia:  no lesions              No abnormal inguinal nodes palpated.              Urethra:  normal appearing urethra with no masses, tenderness or lesions  Bartholins and Skenes: normal                 Vagina: normal appearing vagina with normal color and discharge, no lesions              Cervix: no lesions              Pap taken: {yes no:314532} Bimanual Exam:  Uterus:  normal size, contour, position, consistency, mobility, non-tender              Adnexa: no mass, fullness, tenderness              Rectal exam: {yes no:314532}.  Confirms.              Anus:  normal sphincter tone, no lesions  Chaperone was present for exam.  Assessment:   Well woman visit with normal exam.   Plan: Mammogram screening discussed. Self breast awareness  reviewed. Pap and HR HPV as above. Guidelines for Calcium, Vitamin D, regular exercise program including cardiovascular and weight bearing exercise.   Follow up annually and prn.   Additional counseling given.  {yes [id number]. _______ minutes face to face time of which over 50% was spent in counseling.    After visit summary provided.

## 2020-03-10 ENCOUNTER — Ambulatory Visit: Payer: Managed Care, Other (non HMO) | Admitting: Obstetrics and Gynecology

## 2020-11-29 HISTORY — PX: REPLACEMENT TOTAL KNEE: SUR1224

## 2021-04-10 ENCOUNTER — Other Ambulatory Visit (HOSPITAL_COMMUNITY)
Admission: RE | Admit: 2021-04-10 | Discharge: 2021-04-10 | Disposition: A | Discharge status: Home / Self Care | Payer: Managed Care, Other (non HMO) | Source: Ambulatory Visit | Attending: Radiology | Admitting: Radiology

## 2021-04-10 ENCOUNTER — Encounter: Payer: Self-pay | Admitting: Radiology

## 2021-04-10 ENCOUNTER — Ambulatory Visit (INDEPENDENT_AMBULATORY_CARE_PROVIDER_SITE_OTHER): Payer: Managed Care, Other (non HMO) | Admitting: Radiology

## 2021-04-10 ENCOUNTER — Other Ambulatory Visit: Payer: Self-pay

## 2021-04-10 VITALS — BP 132/82 | Ht 63.5 in | Wt 259.0 lb

## 2021-04-10 DIAGNOSIS — Z01419 Encounter for gynecological examination (general) (routine) without abnormal findings: Secondary | ICD-10-CM | POA: Insufficient documentation

## 2021-04-10 DIAGNOSIS — Z13820 Encounter for screening for osteoporosis: Secondary | ICD-10-CM

## 2021-04-10 NOTE — Progress Notes (Signed)
? ?  Natalie Harding 04/19/1960 [id number] ? ? ?History: Postmenopausal 61 y.o. presents for annual exam. ? ? ?Gynecologic History ?Postmenopausal ?Last Pap: 2020. Results were: normal ?Last mammogram: 2022. Results were: normal ?Last colonoscopy: 2017 ?HRT use: none ? ?Obstetric History ?OB History  ?Gravida Para Term Preterm AB Living  ?_0 ?SAB IAB Ectopic Multiple Live Births  ?_1 ?  ?# Outcome Date GA Lbr Len/2nd Weight Sex Delivery Anes PTL Lv  ?4 Term 03/1988 [redacted]w[redacted]d 7 lb (3.175 kg) M CS-Classical   LIV  ?3 SAB           ?2 IAB           ?1 IAB           ? ? ? ?The following portions of the patient's history were reviewed and updated as appropriate: allergies, current medications, past family history, past medical history, past social history, past surgical history, and problem list. ? ?Review of Systems ?Pertinent items noted in HPI and remainder of comprehensive ROS otherwise negative.  ?Past medical history, past surgical history, family history and social history were all reviewed and documented in the EPIC chart. ? ?Exam: ? ?Vitals:  ? 04/10/21 0959  ?BP: 132/82  ?Weight: 259 lb (117.5 kg)  ?Height: 5' 3.5" (1.613 m)  ? ?Body mass index is 45.16 kg/m?. ? ?General appearance:  Normal ?Thyroid:  Symmetrical, normal in size, without palpable masses or nodularity. ?Respiratory ? Auscultation:  Clear without wheezing or rhonchi ?Cardiovascular ? Auscultation:  Regular rate, without rubs, murmurs or gallops ? Edema/varicosities:  Not grossly evident ?Abdominal ? Soft,nontender, without masses, guarding or rebound. ? Liver/spleen:  No organomegaly noted ? Hernia:  None appreciated ? Skin ? Inspection:  Grossly normal ?Breasts: Examined lying and sitting.  ? Right: Without masses, retractions, nipple discharge or axillary adenopathy. ? ? Left: Without masses, retractions, nipple discharge or axillary adenopathy. ?Genitourinary  ? Inguinal/mons:  Normal without inguinal adenopathy ? External  genitalia:  Normal appearing vulva with no masses, tenderness, or lesions ? BUS/Urethra/Skene's glands:  Normal ? Vagina:  Normal appearing with normal color and discharge, no lesions. Atrophy: mild  ? Cervix:  Normal appearing without discharge or lesions ? Uterus:  Normal in size, shape and contour.  Midline and mobile, nontender ? Adnexa/parametria:   ?  Rt: Normal in size, without masses or tenderness. ?  Lt: Normal in size, without masses or tenderness. ? Anus and perineum: Normal ?  ? ?Patient informed chaperone available to be present for breast and pelvic exam. Patient has requested no chaperone to be present. Patient has been advised what will be completed during breast and pelvic exam.  ? ?Assessment/Plan:   ?1. Well woman exam with routine gynecological exam ?Fasting labs today ?- HgB A1c ?- Comp Met (CMET) ?- Lipid Profile ?- Vitamin D (25 hydroxy) ?- Thyroid Panel With TSH ?- Cytology - PAP( [place]) ? ?2. Osteoporosis screening ?Increase weight bearing exercise, discussed swimming for cardio and strength ?- DG Bone Density; Future  ? ?Discussed SBE, colonoscopy and DEXA screening as directed. Recommend 15109ms of exercise weekly, including weight bearing exercise. Encouraged the use of seatbelts and sunscreen.  ?Return in 1 year for annual or sooner prn. ? ?CHKerry DoryHNP-BC, 10:23 AM 04/10/2021  ?

## 2021-04-11 LAB — COMPREHENSIVE METABOLIC PANEL
AG Ratio: 1.3 (calc) (ref 1.0–2.5)
ALT: 11 U/L (ref 6–29)
AST: 14 U/L (ref 10–35)
Albumin: 3.9 g/dL (ref 3.6–5.1)
Alkaline phosphatase (APISO): 104 U/L (ref 37–153)
BUN: 13 mg/dL (ref 7–25)
CO2: 29 mmol/L (ref 20–32)
Calcium: 9.4 mg/dL (ref 8.6–10.4)
Chloride: 108 mmol/L (ref 98–110)
Creat: 0.71 mg/dL (ref 0.50–1.05)
Globulin: 3.1 g/dL (calc) (ref 1.9–3.7)
Glucose, Bld: 80 mg/dL (ref 65–99)
Potassium: 4.1 mmol/L (ref 3.5–5.3)
Sodium: 143 mmol/L (ref 135–146)
Total Bilirubin: 0.8 mg/dL (ref 0.2–1.2)
Total Protein: 7 g/dL (ref 6.1–8.1)

## 2021-04-11 LAB — THYROID PANEL WITH TSH
Free Thyroxine Index: 2.1 (ref 1.4–3.8)
T3 Uptake: 31 % (ref 22–35)
T4, Total: 6.8 ug/dL (ref 5.1–11.9)
TSH: 2.25 mIU/L (ref 0.40–4.50)

## 2021-04-11 LAB — CYTOLOGY - PAP
Comment: NEGATIVE
Diagnosis: NEGATIVE
High risk HPV: NEGATIVE

## 2021-04-11 LAB — LIPID PANEL
Cholesterol: 193 mg/dL (ref <200)
HDL: 65 mg/dL (ref >=50)
LDL Cholesterol (Calc): 112 mg/dL (calc) — ABNORMAL HIGH
Non-HDL Cholesterol (Calc): 128 mg/dL (calc) (ref <130)
Total CHOL/HDL Ratio: 3 (calc) (ref <5.0)
Triglycerides: 73 mg/dL (ref <150)

## 2021-04-11 LAB — HEMOGLOBIN A1C
Hgb A1c MFr Bld: 5.5 % of total Hgb (ref <5.7)
Mean Plasma Glucose: 111 mg/dL
eAG (mmol/L): 6.2 mmol/L

## 2021-04-11 LAB — VITAMIN D 25 HYDROXY (VIT D DEFICIENCY, FRACTURES): Vit D, 25-Hydroxy: 32 ng/mL (ref 30–100)

## 2021-05-03 ENCOUNTER — Other Ambulatory Visit: Payer: Self-pay | Admitting: Radiology

## 2021-05-03 ENCOUNTER — Ambulatory Visit (INDEPENDENT_AMBULATORY_CARE_PROVIDER_SITE_OTHER): Payer: Managed Care, Other (non HMO)

## 2021-05-03 DIAGNOSIS — Z780 Asymptomatic menopausal state: Secondary | ICD-10-CM | POA: Diagnosis not present

## 2021-05-03 DIAGNOSIS — Z13820 Encounter for screening for osteoporosis: Secondary | ICD-10-CM | POA: Diagnosis not present

## 2022-04-12 ENCOUNTER — Encounter: Payer: Self-pay | Admitting: Radiology

## 2022-04-12 ENCOUNTER — Ambulatory Visit (INDEPENDENT_AMBULATORY_CARE_PROVIDER_SITE_OTHER): Payer: Medicaid Other | Admitting: Radiology

## 2022-04-12 VITALS — BP 124/70 | Ht 63.5 in | Wt 240.0 lb

## 2022-04-12 DIAGNOSIS — Z01419 Encounter for gynecological examination (general) (routine) without abnormal findings: Secondary | ICD-10-CM

## 2022-04-12 DIAGNOSIS — R5383 Other fatigue: Secondary | ICD-10-CM | POA: Diagnosis not present

## 2022-04-12 DIAGNOSIS — E7800 Pure hypercholesterolemia, unspecified: Secondary | ICD-10-CM

## 2022-04-12 NOTE — Progress Notes (Signed)
   Natalie Harding 1960-04-19 [id number]   History: Postmenopausal 62 y.o. presents for annual exam. No gyn concerns, doing well   Gynecologic History Postmenopausal Last Pap: 2020. Results were: normal Last mammogram: 2022. Results were: normal Last colonoscopy: 2017 DEXA: 2023, normal HRT use: none  Obstetric History OB History  Gravida Para Term Preterm AB Living  4 1 1   3 1   SAB IAB Ectopic Multiple Live Births  1 2     1     # Outcome Date GA Lbr Len/2nd Weight Sex Delivery Anes PTL Lv  4 Term 03/1988 [redacted]w[redacted]d  7 lb (3.175 kg) M CS-Classical   LIV  3 SAB           2 IAB           1 IAB              The following portions of the patient's history were reviewed and updated as appropriate: allergies, current medications, past family history, past medical history, past social history, past surgical history, and problem list.  Review of Systems Pertinent items noted in HPI and remainder of comprehensive ROS otherwise negative.  Past medical history, past surgical history, family history and social history were all reviewed and documented in the EPIC chart.  Exam:  Vitals:   04/12/22 1057  BP: 124/70  Weight: 240 lb (108.9 kg)  Height: 5' 3.5" (1.613 m)   Body mass index is 41.85 kg/m.  General appearance:  Normal, obese Thyroid:  Symmetrical, normal in size, without palpable masses or nodularity. Respiratory  Auscultation:  Clear without wheezing or rhonchi Cardiovascular  Auscultation:  Regular rate, without rubs, murmurs or gallops  Edema/varicosities:  Not grossly evident Abdominal  Soft,nontender, without masses, guarding or rebound.  Liver/spleen:  No organomegaly noted  Hernia:  None appreciated  Skin  Inspection:  Grossly normal Breasts: Examined lying and sitting.   Right: Without masses, retractions, nipple discharge or axillary adenopathy.   Left: Without masses, retractions, nipple discharge or axillary adenopathy. Genitourinary   Inguinal/mons:   Normal without inguinal adenopathy  External genitalia:  Normal appearing vulva with no masses, tenderness, or lesions  BUS/Urethra/Skene's glands:  Normal  Vagina:  Normal appearing with normal color and discharge, no lesions. Atrophy: mild   Cervix:  Normal appearing without discharge or lesions  Uterus:  Normal in size, shape and contour.  Midline and mobile, nontender  Adnexa/parametria:     Rt: Normal in size, without masses or tenderness.   Lt: Normal in size, without masses or tenderness.  Anus and perineum: Normal    Patient informed chaperone available to be present for breast and pelvic exam. Patient has requested no chaperone to be present. Patient has been advised what will be completed during breast and pelvic exam.   Assessment/Plan:   1. Well woman exam with routine gynecological exam DEXA normal 2023  2. Elevated LDL cholesterol level - HgB A1c - Lipid Profile - CBC - Comp Met (CMET)   4. Fatigue, unspecified type  - Vitamin D (25 hydroxy) - Thyroid Panel With TSH   Discussed SBE, colonoscopy and DEXA screening as directed. Recommend 187mins of exercise weekly, including weight bearing exercise. Encouraged the use of seatbelts and sunscreen.  Return in 1 year for annual or sooner prn.  Rubbie Battiest B WHNP-BC, 11:19 AM 04/12/2022

## 2022-04-13 LAB — COMPREHENSIVE METABOLIC PANEL
AG Ratio: 1.3 (calc) (ref 1.0–2.5)
ALT: 13 U/L (ref 6–29)
AST: 17 U/L (ref 10–35)
Albumin: 3.7 g/dL (ref 3.6–5.1)
Alkaline phosphatase (APISO): 107 U/L (ref 37–153)
BUN: 15 mg/dL (ref 7–25)
CO2: 26 mmol/L (ref 20–32)
Calcium: 9 mg/dL (ref 8.6–10.4)
Chloride: 109 mmol/L (ref 98–110)
Creat: 0.84 mg/dL (ref 0.50–1.05)
Globulin: 2.9 g/dL (calc) (ref 1.9–3.7)
Glucose, Bld: 81 mg/dL (ref 65–99)
Potassium: 4.3 mmol/L (ref 3.5–5.3)
Sodium: 143 mmol/L (ref 135–146)
Total Bilirubin: 1 mg/dL (ref 0.2–1.2)
Total Protein: 6.6 g/dL (ref 6.1–8.1)

## 2022-04-13 LAB — THYROID PANEL WITH TSH
Free Thyroxine Index: 2.1 (ref 1.4–3.8)
T3 Uptake: 33 % (ref 22–35)
T4, Total: 6.3 ug/dL (ref 5.1–11.9)
TSH: 1.65 mIU/L (ref 0.40–4.50)

## 2022-04-13 LAB — LIPID PANEL
Cholesterol: 197 mg/dL (ref <200)
HDL: 72 mg/dL (ref >=50)
LDL Cholesterol (Calc): 111 mg/dL (calc) — ABNORMAL HIGH
Non-HDL Cholesterol (Calc): 125 mg/dL (calc) (ref <130)
Total CHOL/HDL Ratio: 2.7 (calc) (ref <5.0)
Triglycerides: 49 mg/dL (ref <150)

## 2022-04-13 LAB — HEMOGLOBIN A1C
Hgb A1c MFr Bld: 5.5 % of total Hgb (ref <5.7)
Mean Plasma Glucose: 111 mg/dL
eAG (mmol/L): 6.2 mmol/L

## 2022-04-13 LAB — CBC
HCT: 38.3 % (ref 35.0–45.0)
Hemoglobin: 12.6 g/dL (ref 11.7–15.5)
MCH: 30.7 pg (ref 27.0–33.0)
MCHC: 32.9 g/dL (ref 32.0–36.0)
MCV: 93.4 fL (ref 80.0–100.0)
MPV: 10.3 fL (ref 7.5–12.5)
Platelets: 191 10*3/uL (ref 140–400)
RBC: 4.1 10*6/uL (ref 3.80–5.10)
RDW: 12.8 % (ref 11.0–15.0)
WBC: 4.2 10*3/uL (ref 3.8–10.8)

## 2022-04-13 LAB — VITAMIN D 25 HYDROXY (VIT D DEFICIENCY, FRACTURES): Vit D, 25-Hydroxy: 30 ng/mL (ref 30–100)

## 2023-04-15 ENCOUNTER — Ambulatory Visit: Payer: Medicaid Other | Admitting: Obstetrics and Gynecology

## 2023-05-13 ENCOUNTER — Ambulatory Visit (INDEPENDENT_AMBULATORY_CARE_PROVIDER_SITE_OTHER): Payer: Medicaid Other | Admitting: Radiology

## 2023-05-13 ENCOUNTER — Encounter: Payer: Self-pay | Admitting: Radiology

## 2023-05-13 VITALS — BP 128/72 | HR 62 | Ht 64.25 in | Wt 268.0 lb

## 2023-05-13 DIAGNOSIS — Z1331 Encounter for screening for depression: Secondary | ICD-10-CM | POA: Diagnosis not present

## 2023-05-13 DIAGNOSIS — Z01419 Encounter for gynecological examination (general) (routine) without abnormal findings: Secondary | ICD-10-CM | POA: Diagnosis not present

## 2023-05-13 NOTE — Patient Instructions (Signed)
 Preventive Care 16-64 Years Old, Female  Preventive care refers to lifestyle choices and visits with your health care provider that can promote health and wellness. Preventive care visits are also called wellness exams.  What can I expect for my preventive care visit?  Counseling  Your health care provider may ask you questions about your:  Medical history, including:  Past medical problems.  Family medical history.  Pregnancy history.  Current health, including:  Menstrual cycle.  Method of birth control.  Emotional well-being.  Home life and relationship well-being.  Sexual activity and sexual health.  Lifestyle, including:  Alcohol, nicotine or tobacco, and drug use.  Access to firearms.  Diet, exercise, and sleep habits.  Work and work Astronomer.  Sunscreen use.  Safety issues such as seatbelt and bike helmet use.  Physical exam  Your health care provider will check your:  Height and weight. These may be used to calculate your BMI (body mass index). BMI is a measurement that tells if you are at a healthy weight.  Waist circumference. This measures the distance around your waistline. This measurement also tells if you are at a healthy weight and may help predict your risk of certain diseases, such as type 2 diabetes and high blood pressure.  Heart rate and blood pressure.  Body temperature.  Skin for abnormal spots.  What immunizations do I need?    Vaccines are usually given at various ages, according to a schedule. Your health care provider will recommend vaccines for you based on your age, medical history, and lifestyle or other factors, such as travel or where you work.  What tests do I need?  Screening  Your health care provider may recommend screening tests for certain conditions. This may include:  Lipid and cholesterol levels.  Diabetes screening. This is done by checking your blood sugar (glucose) after you have not eaten for a while (fasting).  Pelvic exam and Pap test.  Hepatitis B test.  Hepatitis C  test.  HIV (human immunodeficiency virus) test.  STI (sexually transmitted infection) testing, if you are at risk.  Lung cancer screening.  Colorectal cancer screening.  Mammogram. Talk with your health care provider about when you should start having regular mammograms. This may depend on whether you have a family history of breast cancer.  BRCA-related cancer screening. This may be done if you have a family history of breast, ovarian, tubal, or peritoneal cancers.  Bone density scan. This is done to screen for osteoporosis.  Talk with your health care provider about your test results, treatment options, and if necessary, the need for more tests.  Follow these instructions at home:  Eating and drinking    Eat a diet that includes fresh fruits and vegetables, whole grains, lean protein, and low-fat dairy products.  Take vitamin and mineral supplements as recommended by your health care provider.  Do not drink alcohol if:  Your health care provider tells you not to drink.  You are pregnant, may be pregnant, or are planning to become pregnant.  If you drink alcohol:  Limit how much you have to 0-1 drink a day.  Know how much alcohol is in your drink. In the [place] one drink equals one 12 oz bottle of beer (355 mL), one 5 oz glass of wine (148 mL), or one 1 oz glass of hard liquor (44 mL).  Lifestyle  Brush your teeth every morning and night with fluoride toothpaste. Floss one time each day.  Exercise for at least  30 minutes 5 or more days each week.  Do not use any products that contain nicotine or tobacco. These products include cigarettes, chewing tobacco, and vaping devices, such as e-cigarettes. If you need help quitting, ask your health care provider.  Do not use drugs.  If you are sexually active, practice safe sex. Use a condom or other form of protection to prevent STIs.  If you do not wish to become pregnant, use a form of birth control. If you plan to become pregnant, see your health care provider for a  prepregnancy visit.  Take aspirin only as told by your health care provider. Make sure that you understand how much to take and what form to take. Work with your health care provider to find out whether it is safe and beneficial for you to take aspirin daily.  Find healthy ways to manage stress, such as:  Meditation, yoga, or listening to music.  Journaling.  Talking to a trusted person.  Spending time with friends and family.  Minimize exposure to UV radiation to reduce your risk of skin cancer.  Safety  Always wear your seat belt while driving or riding in a vehicle.  Do not drive:  If you have been drinking alcohol. Do not ride with someone who has been drinking.  When you are tired or distracted.  While texting.  If you have been using any mind-altering substances or drugs.  Wear a helmet and other protective equipment during sports activities.  If you have firearms in your house, make sure you follow all gun safety procedures.  Seek help if you have been physically or sexually abused.  What's next?  Visit your health care provider once a year for an annual wellness visit.  Ask your health care provider how often you should have your eyes and teeth checked.  Stay up to date on all vaccines.  This information is not intended to replace advice given to you by your health care provider. Make sure you discuss any questions you have with your health care provider.  Document Revised: 07/13/2020 Document Reviewed: 07/13/2020  Elsevier Patient Education  2024 [employer].

## 2023-05-13 NOTE — Progress Notes (Signed)
   Natalie Harding [birth year] [id number]   History: Postmenopausal 63 y.o. presents for annual exam. C/o continue fatigue, has appt with PCP today. Does not exercise.   Gynecologic History Postmenopausal Last Pap: 2023. Results were: normal Last mammogram: 2025. Results were: normal Last colonoscopy: 2017 DEXA: 2023, normal HRT use: none  Obstetric History OB History  Gravida Para Term Preterm AB Living  4 1 1  3 1   SAB IAB Ectopic Multiple Live Births  1 2   1     # Outcome Date GA Lbr Len/2nd Weight Sex Type Anes PTL Lv  4 Term 03/1988 [redacted]w[redacted]d  7 lb (3.175 kg) M CS-Classical   LIV  3 SAB           2 IAB           1 IAB               05/13/2023   10:09 AM  Depression screen PHQ 2/9  Decreased Interest 0  Down, Depressed, Hopeless 0  PHQ - 2 Score 0     The following portions of the patient's history were reviewed and updated as appropriate: allergies, current medications, past family history, past medical history, past social history, past surgical history, and problem list.  Review of Systems Pertinent items noted in HPI and remainder of comprehensive ROS otherwise negative.  Past medical history, past surgical history, family history and social history were all reviewed and documented in the EPIC chart.  Exam:  Vitals:   05/13/23 1006  BP: 128/72  Pulse: 62  SpO2: 100%  Weight: 268 lb (121.6 kg)  Height: 5' 4.25" (1.632 m)   Body mass index is 45.64 kg/m.  General appearance:  Normal, obese Thyroid:  Symmetrical, normal in size, without palpable masses or nodularity. Respiratory  Auscultation:  Clear without wheezing or rhonchi Cardiovascular  Auscultation:  Regular rate, without rubs, murmurs or gallops  Edema/varicosities:  Not grossly evident Abdominal  Soft,nontender, without masses, guarding or rebound.  Liver/spleen:  No organomegaly noted  Hernia:  None appreciated  Skin  Inspection:  Grossly normal Breasts: Examined lying and  sitting.   Right: Without masses, retractions, nipple discharge or axillary adenopathy.   Left: Without masses, retractions, nipple discharge or axillary adenopathy. Genitourinary   Inguinal/mons:  Normal without inguinal adenopathy  External genitalia:  Normal appearing vulva with no masses, tenderness, or lesions  BUS/Urethra/Skene's glands:  Normal  Vagina:  Normal appearing with normal color and discharge, no lesions. Atrophy: mild   Cervix:  Normal appearing without discharge or lesions  Uterus:  Normal in size, shape and contour.  Midline and mobile, nontender  Adnexa/parametria:     Rt: Normal in size, without masses or tenderness.   Lt: Normal in size, without masses or tenderness.  Anus and perineum: Normal   Limmie Ren present for exam  Assessment/Plan:   1. Well woman exam with routine gynecological exam DEXA normal 2023 Pap due 2026 Mammo yearly Labs with PCP   Return in 1 year for annual or sooner prn.  Everardo Voris B WHNP-BC, 10:49 AM 05/13/2023

## 2024-05-13 ENCOUNTER — Ambulatory Visit: Admitting: Radiology
